# Patient Record
Sex: Female | Born: 1960 | Race: White | Hispanic: No | Marital: Single | Smoking: Current every day smoker
Health system: Southern US, Community
[De-identification: ages and names within clinical notes are randomized; demographics above are authoritative.]

## PROBLEM LIST (undated history)

## (undated) DIAGNOSIS — M1990 Unspecified osteoarthritis, unspecified site: Secondary | ICD-10-CM

## (undated) DIAGNOSIS — F329 Major depressive disorder, single episode, unspecified: Secondary | ICD-10-CM

## (undated) DIAGNOSIS — T4145XA Adverse effect of unspecified anesthetic, initial encounter: Secondary | ICD-10-CM

## (undated) DIAGNOSIS — R635 Abnormal weight gain: Secondary | ICD-10-CM

## (undated) DIAGNOSIS — I10 Essential (primary) hypertension: Secondary | ICD-10-CM

## (undated) DIAGNOSIS — Z780 Asymptomatic menopausal state: Secondary | ICD-10-CM

## (undated) DIAGNOSIS — C801 Malignant (primary) neoplasm, unspecified: Secondary | ICD-10-CM

## (undated) DIAGNOSIS — M545 Low back pain, unspecified: Secondary | ICD-10-CM

## (undated) DIAGNOSIS — F32A Depression, unspecified: Secondary | ICD-10-CM

## (undated) DIAGNOSIS — T7840XA Allergy, unspecified, initial encounter: Secondary | ICD-10-CM

## (undated) DIAGNOSIS — B1920 Unspecified viral hepatitis C without hepatic coma: Secondary | ICD-10-CM

## (undated) DIAGNOSIS — T8859XA Other complications of anesthesia, initial encounter: Secondary | ICD-10-CM

## (undated) DIAGNOSIS — L508 Other urticaria: Secondary | ICD-10-CM

## (undated) DIAGNOSIS — F419 Anxiety disorder, unspecified: Secondary | ICD-10-CM

## (undated) DIAGNOSIS — G709 Myoneural disorder, unspecified: Secondary | ICD-10-CM

## (undated) DIAGNOSIS — M8430XA Stress fracture, unspecified site, initial encounter for fracture: Secondary | ICD-10-CM

## (undated) DIAGNOSIS — J45909 Unspecified asthma, uncomplicated: Secondary | ICD-10-CM

## (undated) DIAGNOSIS — J449 Chronic obstructive pulmonary disease, unspecified: Secondary | ICD-10-CM

## (undated) DIAGNOSIS — F319 Bipolar disorder, unspecified: Secondary | ICD-10-CM

## (undated) DIAGNOSIS — M542 Cervicalgia: Secondary | ICD-10-CM

## (undated) DIAGNOSIS — M810 Age-related osteoporosis without current pathological fracture: Secondary | ICD-10-CM

## (undated) DIAGNOSIS — F39 Unspecified mood [affective] disorder: Secondary | ICD-10-CM

## (undated) HISTORY — DX: Anxiety disorder, unspecified: F41.9

## (undated) HISTORY — DX: Low back pain: M54.5

## (undated) HISTORY — DX: Unspecified osteoarthritis, unspecified site: M19.90

## (undated) HISTORY — PX: WISDOM TOOTH EXTRACTION: SHX21

## (undated) HISTORY — DX: Age-related osteoporosis without current pathological fracture: M81.0

## (undated) HISTORY — DX: Abnormal weight gain: R63.5

## (undated) HISTORY — PX: TUBAL LIGATION: SHX77

## (undated) HISTORY — DX: Asymptomatic menopausal state: Z78.0

## (undated) HISTORY — DX: Allergy, unspecified, initial encounter: T78.40XA

## (undated) HISTORY — DX: Myoneural disorder, unspecified: G70.9

## (undated) HISTORY — DX: Unspecified mood (affective) disorder: F39

## (undated) HISTORY — DX: Depression, unspecified: F32.A

## (undated) HISTORY — DX: Other urticaria: L50.8

## (undated) HISTORY — DX: Major depressive disorder, single episode, unspecified: F32.9

## (undated) HISTORY — DX: Unspecified viral hepatitis C without hepatic coma: B19.20

## (undated) HISTORY — DX: Chronic obstructive pulmonary disease, unspecified: J44.9

## (undated) HISTORY — DX: Low back pain, unspecified: M54.50

## (undated) HISTORY — DX: Essential (primary) hypertension: I10

## (undated) HISTORY — DX: Cervicalgia: M54.2

## (undated) HISTORY — DX: Stress fracture, unspecified site, initial encounter for fracture: M84.30XA

## (undated) HISTORY — DX: Malignant (primary) neoplasm, unspecified: C80.1

## (undated) HISTORY — DX: Unspecified asthma, uncomplicated: J45.909

## (undated) HISTORY — DX: Bipolar disorder, unspecified: F31.9

---

## 2004-05-17 ENCOUNTER — Emergency Department: Payer: Self-pay | Admitting: Unknown Physician Specialty

## 2006-08-09 LAB — HM PAP SMEAR

## 2007-09-21 LAB — HM MAMMOGRAPHY

## 2008-03-29 ENCOUNTER — Emergency Department: Payer: Self-pay | Admitting: Emergency Medicine

## 2008-03-29 ENCOUNTER — Other Ambulatory Visit: Payer: Self-pay

## 2011-06-11 ENCOUNTER — Emergency Department: Payer: Self-pay | Admitting: *Deleted

## 2015-07-07 DIAGNOSIS — R635 Abnormal weight gain: Secondary | ICD-10-CM

## 2015-07-07 DIAGNOSIS — J449 Chronic obstructive pulmonary disease, unspecified: Secondary | ICD-10-CM

## 2015-07-07 DIAGNOSIS — L508 Other urticaria: Secondary | ICD-10-CM

## 2015-07-07 DIAGNOSIS — F32A Depression, unspecified: Secondary | ICD-10-CM | POA: Insufficient documentation

## 2015-07-07 DIAGNOSIS — I10 Essential (primary) hypertension: Secondary | ICD-10-CM

## 2015-07-07 DIAGNOSIS — F39 Unspecified mood [affective] disorder: Secondary | ICD-10-CM

## 2015-07-07 DIAGNOSIS — M545 Low back pain, unspecified: Secondary | ICD-10-CM | POA: Insufficient documentation

## 2015-07-07 DIAGNOSIS — F419 Anxiety disorder, unspecified: Secondary | ICD-10-CM

## 2015-07-07 DIAGNOSIS — J45909 Unspecified asthma, uncomplicated: Secondary | ICD-10-CM | POA: Insufficient documentation

## 2015-07-07 DIAGNOSIS — Z780 Asymptomatic menopausal state: Secondary | ICD-10-CM

## 2015-07-07 DIAGNOSIS — F329 Major depressive disorder, single episode, unspecified: Secondary | ICD-10-CM

## 2015-07-07 DIAGNOSIS — F319 Bipolar disorder, unspecified: Secondary | ICD-10-CM

## 2015-07-07 DIAGNOSIS — M542 Cervicalgia: Secondary | ICD-10-CM

## 2015-07-07 DIAGNOSIS — B1920 Unspecified viral hepatitis C without hepatic coma: Secondary | ICD-10-CM

## 2015-07-08 ENCOUNTER — Encounter: Payer: Self-pay | Admitting: Unknown Physician Specialty

## 2015-07-08 ENCOUNTER — Ambulatory Visit (INDEPENDENT_AMBULATORY_CARE_PROVIDER_SITE_OTHER): Payer: BLUE CROSS/BLUE SHIELD | Admitting: Unknown Physician Specialty

## 2015-07-08 VITALS — BP 146/92 | HR 78 | Temp 98.5°F | Ht 63.1 in | Wt 135.0 lb

## 2015-07-08 DIAGNOSIS — Z0000 Encounter for general adult medical examination without abnormal findings: Secondary | ICD-10-CM | POA: Diagnosis not present

## 2015-07-08 DIAGNOSIS — B1920 Unspecified viral hepatitis C without hepatic coma: Secondary | ICD-10-CM | POA: Diagnosis not present

## 2015-07-08 DIAGNOSIS — Z780 Asymptomatic menopausal state: Secondary | ICD-10-CM

## 2015-07-08 DIAGNOSIS — I10 Essential (primary) hypertension: Secondary | ICD-10-CM | POA: Diagnosis not present

## 2015-07-08 NOTE — Patient Instructions (Signed)
Please do call to schedule your mammogram; the number to schedule one at either [facility] or [facility] is [phone]   

## 2015-07-08 NOTE — Progress Notes (Signed)
BP 146/92 mmHg  Pulse 78  Temp(Src) 98.5 F (36.9 C)  Ht 5' 3.1" (1.603 m)  Wt 135 lb (61.236 kg)  BMI 23.83 kg/m2  SpO2 94%  LMP  (LMP Unknown)   Subjective:    Patient ID: Valerie Goodman, female    DOB: [birth year], 54 y.o.   MRN: [id number]  HPI: Valerie Goodman is a 54 y.o. female  Chief Complaint  Patient presents with  . Annual Exam  . Abdominal Pain    pt states she has hep C and has been having some pain in liver area  . Diarrhea    pt states she has been having trouble with diarrhea, thinks it may be anxiety related   Hypertension Using medications without difficulty Average home BPs   No problems or lightheadedness No chest pain with exertion or shortness of breath No Edema   Relevant past medical, surgical, family and social history reviewed and updated as indicated. Interim medical history since our last visit reviewed. Allergies and medications reviewed and updated.  Review of Systems  Constitutional: Negative.   HENT: Negative.   Eyes: Negative.   Respiratory: Negative.   Cardiovascular: Negative.   Gastrointestinal: Positive for abdominal pain and diarrhea.       She had a biopsy done for her hep C and "turned me loose."  Admits to drinking "al little bit" of wine.  Takes Milk Thistle.  This was about 25 years ago.  Refusing colonoscopy  Endocrine: Negative.   Genitourinary: Negative.   Musculoskeletal: Negative.   Skin: Negative.   Allergic/Immunologic: Negative.   Neurological: Negative.   Psychiatric/Behavioral: Negative.     Per HPI unless specifically indicated above     Objective:    BP 146/92 mmHg  Pulse 78  Temp(Src) 98.5 F (36.9 C)  Ht 5' 3.1" (1.603 m)  Wt 135 lb (61.236 kg)  BMI 23.83 kg/m2  SpO2 94%  LMP  (LMP Unknown)  Wt Readings from Last 3 Encounters:  07/08/15 135 lb (61.236 kg)  07/05/14 115 lb (52.164 kg)    Physical Exam  Constitutional: She is oriented to person, place, and time. She appears well-developed and  well-nourished.  HENT:  Head: Normocephalic and atraumatic.  Eyes: Pupils are equal, round, and reactive to light. Right eye exhibits no discharge. Left eye exhibits no discharge. No scleral icterus.  Neck: Normal range of motion. Neck supple. Carotid bruit is not present. No thyromegaly present.  Cardiovascular: Normal rate, regular rhythm and normal heart sounds.  Exam reveals no gallop and no friction rub.   No murmur heard. Pulmonary/Chest: Effort normal and breath sounds normal. No respiratory distress. She has no wheezes. She has no rales.  Abdominal: Soft. Bowel sounds are normal. There is no tenderness. There is no rebound.  Genitourinary: Rectum normal and vagina normal. No breast swelling, tenderness or discharge. Pelvic exam was performed with patient prone. There is no rash, tenderness or lesion on the right labia. There is no rash, tenderness or lesion on the left labia. Cervix exhibits no motion tenderness, no discharge and no friability.  Musculoskeletal: Normal range of motion.  Lymphadenopathy:    She has no cervical adenopathy.  Neurological: She is alert and oriented to person, place, and time.  Skin: Skin is warm, dry and intact. No rash noted.  Psychiatric: She has a normal mood and affect. Her speech is normal and behavior is normal. Judgment and thought content normal. Cognition and memory are normal.  Vitals reviewed.  Results for orders placed or performed in visit on 07/07/15  HM MAMMOGRAPHY  Result Value Ref Range   HM Mammogram from PP   HM PAP SMEAR  Result Value Ref Range   HM Pap smear from PP       Assessment & Plan:   Problem List Items Addressed This Visit      Unprioritized   HCV (hepatitis C virus) - Primary   Relevant Orders   Comprehensive metabolic panel   Menopause   Relevant Orders   DG Bone Density   Hypertension    Other Visit Diagnoses    Annual physical exam        Relevant Orders    CBC with Differential/Platelet    HIV  antibody    Lipid Panel w/o Chol/HDL Ratio    TSH    VITAMIN D 25 Hydroxy (Vit-D Deficiency, Fractures)    MM DIGITAL SCREENING BILATERAL    DG Bone Density    IGP, Aptima HPV, rfx 16/18,45        Follow up plan: Return in about 6 months (around 01/06/2016).

## 2015-07-09 ENCOUNTER — Other Ambulatory Visit: Payer: Self-pay | Admitting: Unknown Physician Specialty

## 2015-07-09 DIAGNOSIS — B182 Chronic viral hepatitis C: Secondary | ICD-10-CM

## 2015-07-09 LAB — COMPREHENSIVE METABOLIC PANEL
ALBUMIN: 4.4 g/dL (ref 3.5–5.5)
ALT: 68 IU/L — ABNORMAL HIGH (ref 0–32)
AST: 59 IU/L — AB (ref 0–40)
Albumin/Globulin Ratio: 1.6 (ref 1.1–2.5)
Alkaline Phosphatase: 126 IU/L — ABNORMAL HIGH (ref 39–117)
BUN/Creatinine Ratio: 19 (ref 9–23)
BUN: 12 mg/dL (ref 6–24)
Bilirubin Total: 0.3 mg/dL (ref 0.0–1.2)
CO2: 25 mmol/L (ref 18–29)
CREATININE: 0.62 mg/dL (ref 0.57–1.00)
Calcium: 10 mg/dL (ref 8.7–10.2)
Chloride: 101 mmol/L (ref 96–106)
GFR calc Af Amer: 117 mL/min/{1.73_m2} (ref >59)
GFR calc non Af Amer: 102 mL/min/{1.73_m2} (ref >59)
GLUCOSE: 91 mg/dL (ref 65–99)
Globulin, Total: 2.7 g/dL (ref 1.5–4.5)
Potassium: 4.6 mmol/L (ref 3.5–5.2)
Sodium: 140 mmol/L (ref 134–144)
Total Protein: 7.1 g/dL (ref 6.0–8.5)

## 2015-07-09 LAB — CBC WITH DIFFERENTIAL/PLATELET
Basophils Absolute: 0.1 10*3/uL (ref 0.0–0.2)
Basos: 1 %
EOS (ABSOLUTE): 0.3 10*3/uL (ref 0.0–0.4)
Eos: 2 %
HEMOGLOBIN: 15.4 g/dL (ref 11.1–15.9)
Hematocrit: 44.6 % (ref 34.0–46.6)
Immature Grans (Abs): 0.1 10*3/uL (ref 0.0–0.1)
Immature Granulocytes: 1 %
LYMPHS ABS: 3.1 10*3/uL (ref 0.7–3.1)
LYMPHS: 29 %
MCH: 30.9 pg (ref 26.6–33.0)
MCHC: 34.5 g/dL (ref 31.5–35.7)
MCV: 90 fL (ref 79–97)
Monocytes Absolute: 0.9 10*3/uL (ref 0.1–0.9)
Monocytes: 8 %
Neutrophils Absolute: 6.5 10*3/uL (ref 1.4–7.0)
Neutrophils: 59 %
Platelets: 260 10*3/uL (ref 150–379)
RBC: 4.98 x10E6/uL (ref 3.77–5.28)
RDW: 14.7 % (ref 12.3–15.4)
WBC: 10.9 10*3/uL — ABNORMAL HIGH (ref 3.4–10.8)

## 2015-07-09 LAB — HIV ANTIBODY (ROUTINE TESTING W REFLEX): HIV Screen 4th Generation wRfx: NONREACTIVE

## 2015-07-09 LAB — TSH: TSH: 1.8 u[IU]/mL (ref 0.450–4.500)

## 2015-07-09 LAB — LIPID PANEL W/O CHOL/HDL RATIO
Cholesterol, Total: 209 mg/dL — ABNORMAL HIGH (ref 100–199)
HDL: 98 mg/dL (ref >39)
LDL CALC: 93 mg/dL (ref 0–99)
Triglycerides: 91 mg/dL (ref 0–149)
VLDL Cholesterol Cal: 18 mg/dL (ref 5–40)

## 2015-07-09 LAB — VITAMIN D 25 HYDROXY (VIT D DEFICIENCY, FRACTURES): VIT D 25 HYDROXY: 44.2 ng/mL (ref 30.0–100.0)

## 2015-07-15 LAB — IGP, APTIMA HPV, RFX 16/18,45
HPV Aptima: POSITIVE — AB
HPV Genotype 16: NEGATIVE
HPV Genotype 18,45: NEGATIVE
PAP Smear Comment: 0

## 2015-07-24 ENCOUNTER — Encounter: Payer: Self-pay | Admitting: Gastroenterology

## 2015-07-24 ENCOUNTER — Encounter: Payer: Self-pay | Admitting: Unknown Physician Specialty

## 2015-07-24 ENCOUNTER — Telehealth: Payer: Self-pay

## 2015-07-24 DIAGNOSIS — IMO0002 Reserved for concepts with insufficient information to code with codable children: Secondary | ICD-10-CM

## 2015-07-24 NOTE — Telephone Encounter (Signed)
When I called patient about other results the other day, she asked about her pap smear results.

## 2015-07-24 NOTE — Telephone Encounter (Signed)
Pt with normal pap smear and normal HPV.  Will need to recheck in 1 year.

## 2015-07-25 NOTE — Telephone Encounter (Signed)
Left message with pt to do co testing in 1 year.

## 2015-07-25 NOTE — Telephone Encounter (Signed)
Elnita MaxwellCheryl, please document the phone call to the patient so we can close this encounter.

## 2015-08-25 ENCOUNTER — Ambulatory Visit (INDEPENDENT_AMBULATORY_CARE_PROVIDER_SITE_OTHER): Payer: BLUE CROSS/BLUE SHIELD | Admitting: Gastroenterology

## 2015-08-25 ENCOUNTER — Encounter: Payer: Self-pay | Admitting: Gastroenterology

## 2015-08-25 ENCOUNTER — Encounter (INDEPENDENT_AMBULATORY_CARE_PROVIDER_SITE_OTHER): Payer: Self-pay

## 2015-08-25 VITALS — BP 152/96 | HR 76 | Temp 98.2°F | Ht 63.0 in | Wt 134.5 lb

## 2015-08-25 DIAGNOSIS — B182 Chronic viral hepatitis C: Secondary | ICD-10-CM

## 2015-08-25 NOTE — Progress Notes (Signed)
Gastroenterology Consultation  Referring Provider:     No ref. provider found Primary Care Physician:  Vonita Moss, MD Primary Gastroenterologist:  Dr. Servando Snare     Reason for Consultation:     Hepatitis C        HPI:   Valerie Goodman is a 55 y.o. y/o female referred for consultation & management of hepatitis C by Dr. Vonita Moss, MD.  This patient comes in today with a history of hepatitis C. The patient was evaluated many years ago at [facility] by Dr. Foy Guadalajara and had a liver biopsy at that time. The patient reports that she drinks approximately a half liter of wine every night. The patient states that she believes she was infected with hepatitis C and using IV drugs back in the early 90s. The patient denies any further use of IV drugs. There is no report of any unexplained weight loss black stools or bloody stools. The patient also denies ever having a screening colonoscopy in the past. There is no report of any abdominal pain, jaundice, fevers or chills. She does not know the genotype of the hepatitis C she has noted she know the results of the liver biopsy done back when she was evaluated in the past.  Past Medical History  Diagnosis Date  . HCV (hepatitis C virus)   . Low back pain   . Cervical pain   . Menopause   . Depression   . Anxiety   . Asthma   . Abnormal weight gain   . Bipolar 1 disorder (HCC)   . Chronic urticaria   . COPD (chronic obstructive pulmonary disease) (HCC)   . Mood disorder (HCC)   . Hypertension   . Stress fracture     Past Surgical History  Procedure Laterality Date  . Tubal ligation      Prior to Admission medications   Medication Sig Start Date End Date Taking? Authorizing Provider  albuterol (PROVENTIL HFA;VENTOLIN HFA) 108 (90 BASE) MCG/ACT inhaler Inhale 2 puffs into the lungs every 6 (six) hours as needed for wheezing or shortness of breath.   Yes Historical Provider, MD  budesonide-formoterol (SYMBICORT) 160-4.5 MCG/ACT inhaler Inhale 2 puffs into  the lungs 2 (two) times daily.   Yes Historical Provider, MD  busPIRone (BUSPAR) 15 MG tablet Take 15 mg by mouth.   Yes Historical Provider, MD  gabapentin (NEURONTIN) 300 MG capsule Take 300 mg by mouth 3 (three) times daily.   Yes Historical Provider, MD  lisinopril (PRINIVIL,ZESTRIL) 10 MG tablet Take 10 mg by mouth daily.   Yes Historical Provider, MD  sertraline (ZOLOFT) 100 MG tablet Take 100 mg by mouth daily.   Yes Historical Provider, MD    Family History  Problem Relation Age of Onset  . Cancer Mother     uterine  . Arthritis Mother   . Cancer Father   . Mental illness Father     bipolar  . Hypertension Son   . Asthma Son   . Polymyositis Maternal Grandmother   . Cancer Maternal Grandfather   . Heart disease Paternal Grandmother     MI  . Cancer Paternal Grandfather   . Cancer Son     melanoma     Social History  Substance Use Topics  . Smoking status: Current Every Day Smoker -- 1.00 packs/day    Types: Cigarettes  . Smokeless tobacco: Never Used  . Alcohol Use: 0.0 oz/week    0 Standard drinks or equivalent per week  Comment: on occasion    Allergies as of 08/25/2015 - Review Complete 08/25/2015  Allergen Reaction Noted  . Mobic [meloxicam] Swelling 07/07/2015    Review of Systems:    All systems reviewed and negative except where noted in HPI.   Physical Exam:  BP 152/96 mmHg  Pulse 76  Temp(Src) 98.2 F (36.8 C) (Oral)  Ht  (1.6 m)  Wt 134 lb 8 oz (61.009 kg)  BMI 23.83 kg/m2  LMP  (LMP Unknown) No LMP recorded (lmp unknown). Patient is postmenopausal. Psych:  Alert and cooperative. Normal mood and affect. General:   Alert,  Well-developed, well-nourished, pleasant and cooperative in NAD Head:  Normocephalic and atraumatic. Eyes:  Sclera clear, no icterus.   Conjunctiva pink. Ears:  Normal auditory acuity. Nose:  No deformity, discharge, or lesions. Mouth:  No deformity or lesions,oropharynx pink & moist. Neck:  Supple; no masses or  thyromegaly. Lungs:  Respirations even and unlabored.  Clear throughout to auscultation.   No wheezes, crackles, or rhonchi. No acute distress. Heart:  Regular rate and rhythm; no murmurs, clicks, rubs, or gallops. Abdomen:  Normal bowel sounds.  No bruits.  Soft, non-tender and non-distended without masses, hepatosplenomegaly or hernias noted.  No guarding or rebound tenderness.  Negative Carnett sign.   Rectal:  Deferred.  Msk:  Symmetrical without gross deformities.  Good, equal movement & strength bilaterally. Pulses:  Normal pulses noted. Extremities:  No clubbing or edema.  No cyanosis. Neurologic:  Alert and oriented x3;  grossly normal neurologically. Skin:  Intact without significant lesions or rashes.  No jaundice. Lymph Nodes:  No significant cervical adenopathy. Psych:  Alert and cooperative. Normal mood and affect.  Imaging Studies: No results found.  Assessment and Plan:   Valerie Goodman is a 55 y.o. y/o female who has a history of hepatitis C but has never been treated. The patient has been told to stop drinking her half liter of wine per day that she has been doing. The patient will be sent off for more current labs and for genotype. She will also have a fibrosis scan prior to starting treatment. The patient has been told that she needs to avoid drinking during treatment. She will also consider having a screening colonoscopy. The patient was told the risks of not having a colonoscopy but the patient states that she will think about it because she is changing positions at work and she may not be able to take off the time to have a colonoscopy. She reports that she will call us back if she decides to have the colonoscopy. Otherwise the patient will be evaluated for treatment for hepatitis C and will be contacted with the results of her labs.   Note: This dictation was prepared with Dragon dictation along with smaller phrase technology. Any transcriptional errors that result from  this process are unintentional.

## 2015-08-26 LAB — HEPATITIS B SURFACE ANTIBODY,QUALITATIVE: Hep B Surface Ab, Qual: NONREACTIVE

## 2015-08-26 LAB — HEPATITIS B SURFACE ANTIGEN: Hepatitis B Surface Ag: NEGATIVE

## 2015-08-27 ENCOUNTER — Telehealth: Payer: Self-pay | Admitting: Family Medicine

## 2015-08-27 NOTE — Telephone Encounter (Signed)
Routing to provider  

## 2015-08-27 NOTE — Telephone Encounter (Signed)
The orders are in the chart.

## 2015-08-27 NOTE — Telephone Encounter (Signed)
Patient called needing a referral to have her mammogram and bone density. She wants to know when and where she would go to get this done. Please call patient to the attached phone number, thanks.

## 2015-08-27 NOTE — Telephone Encounter (Signed)
Called and let patient know orders were already placed and gave patient the number for [facility] to call and schedule her appointments.

## 2015-08-28 ENCOUNTER — Telehealth: Payer: Self-pay | Admitting: Gastroenterology

## 2015-08-28 NOTE — Telephone Encounter (Signed)
colonoscopy

## 2015-08-29 LAB — IGG, IGA, IGM

## 2015-08-29 LAB — CERULOPLASMIN

## 2015-08-29 LAB — HEPATITIS C GENOTYPE

## 2015-08-29 LAB — HEPATITIS A ANTIBODY, TOTAL

## 2015-08-29 LAB — ALPHA-1-ANTITRYPSIN

## 2015-08-29 LAB — HEPATITIS C ANTIBODY

## 2015-08-29 LAB — ANTI-SMOOTH MUSCLE ANTIBODY, IGG

## 2015-08-31 LAB — IGG, IGA, IGM
IGA/IMMUNOGLOBULIN A, SERUM: 232 mg/dL (ref 87–352)
IGM (IMMUNOGLOBULIN M), SRM: 168 mg/dL (ref 26–217)
IgG (Immunoglobin G), Serum: 1150 mg/dL (ref 700–1600)

## 2015-08-31 LAB — HEPATITIS C ANTIBODY

## 2015-08-31 LAB — SPECIMEN STATUS REPORT

## 2015-08-31 LAB — ANTI-SMOOTH MUSCLE ANTIBODY, IGG: SMOOTH MUSCLE AB: 10 U (ref 0–19)

## 2015-08-31 LAB — ALPHA-1-ANTITRYPSIN: A-1 Antitrypsin: 160 mg/dL (ref 90–200)

## 2015-08-31 LAB — HEPATITIS C GENOTYPE

## 2015-08-31 LAB — CERULOPLASMIN: Ceruloplasmin: 37.8 mg/dL (ref 19.0–39.0)

## 2015-08-31 LAB — HEPATITIS A ANTIBODY, TOTAL: HEP A TOTAL AB: NEGATIVE

## 2015-09-01 LAB — HCV RT-PCR, QUANT (NON-GRAPH)

## 2015-09-01 LAB — CBC WITH DIFFERENTIAL/PLATELET
BASOS ABS: 0.1 10*3/uL (ref 0.0–0.2)
Basos: 0 %
EOS (ABSOLUTE): 0.3 10*3/uL (ref 0.0–0.4)
EOS: 3 %
HEMATOCRIT: 47.2 % — AB (ref 34.0–46.6)
HEMOGLOBIN: 16.2 g/dL — AB (ref 11.1–15.9)
IMMATURE GRANS (ABS): 0.1 10*3/uL (ref 0.0–0.1)
IMMATURE GRANULOCYTES: 1 %
LYMPHS: 32 %
Lymphocytes Absolute: 3.7 10*3/uL — ABNORMAL HIGH (ref 0.7–3.1)
MCH: 29.8 pg (ref 26.6–33.0)
MCHC: 34.3 g/dL (ref 31.5–35.7)
MCV: 87 fL (ref 79–97)
MONOCYTES: 7 %
Monocytes Absolute: 0.9 10*3/uL (ref 0.1–0.9)
NEUTROS PCT: 57 %
Neutrophils Absolute: 6.7 10*3/uL (ref 1.4–7.0)
Platelets: 273 10*3/uL (ref 150–379)
RBC: 5.43 x10E6/uL — AB (ref 3.77–5.28)
RDW: 14.3 % (ref 12.3–15.4)
WBC: 11.6 10*3/uL — AB (ref 3.4–10.8)

## 2015-09-01 LAB — HEPATIC FUNCTION PANEL
ALBUMIN: 4.8 g/dL (ref 3.5–5.5)
ALK PHOS: 125 IU/L — AB (ref 39–117)
ALT: 61 IU/L — ABNORMAL HIGH (ref 0–32)
AST: 63 IU/L — AB (ref 0–40)
BILIRUBIN TOTAL: 0.2 mg/dL (ref 0.0–1.2)
Bilirubin, Direct: 0.11 mg/dL (ref 0.00–0.40)
TOTAL PROTEIN: 8 g/dL (ref 6.0–8.5)

## 2015-09-01 LAB — PROTIME-INR
INR: 1 (ref 0.8–1.2)
Prothrombin Time: 10.1 s (ref 9.1–12.0)

## 2015-09-01 LAB — MITOCHONDRIAL ANTIBODIES: MITOCHONDRIAL AB: 3.3 U (ref 0.0–20.0)

## 2015-09-01 NOTE — Telephone Encounter (Signed)
Pt was seen in office with Dr. Servando Snare  On 07/3015 and informed him if she decided to have a colonoscopy she would call back and scheduled. See office note.

## 2015-09-02 ENCOUNTER — Other Ambulatory Visit: Payer: Self-pay

## 2015-09-02 ENCOUNTER — Telehealth: Payer: Self-pay

## 2015-09-02 NOTE — Telephone Encounter (Signed)
-----   Message from Darren Wohl, MD sent at 09/01/2015  6:40 PM EST ----- The patient had hepatitis C genotype one and needs to be set up for Treatment. 

## 2015-09-02 NOTE — Telephone Encounter (Signed)
-----   Message from Midge Minium, MD sent at 09/01/2015  6:40 PM EST ----- The patient had hepatitis C genotype one and needs to be set up for Treatment.

## 2015-09-02 NOTE — Telephone Encounter (Signed)
Pt notified of lab results. Pt has decided she wanted a screening colonoscopy

## 2015-09-02 NOTE — Telephone Encounter (Signed)
Pt scheduled for her screening colonoscopy on Friday, Feb 17th. Instructs/rx mailed.

## 2015-09-02 NOTE — Telephone Encounter (Signed)
Pt has been notified of lab results. Pt has been scheduled for US/tissue elastography at [facility] on2/8/17. Pt is aware we are waiting on [place] prior to starting treatment.

## 2015-09-03 ENCOUNTER — Ambulatory Visit
Admission: RE | Admit: 2015-09-03 | Discharge: 2015-09-03 | Disposition: A | Discharge status: Home / Self Care | Payer: BLUE CROSS/BLUE SHIELD | Source: Ambulatory Visit | Attending: Gastroenterology | Admitting: Gastroenterology

## 2015-09-03 ENCOUNTER — Inpatient Hospital Stay: Admission: RE | Admit: 2015-09-03 | Payer: Self-pay | Source: Ambulatory Visit

## 2015-09-03 DIAGNOSIS — B182 Chronic viral hepatitis C: Secondary | ICD-10-CM | POA: Insufficient documentation

## 2015-09-03 DIAGNOSIS — R160 Hepatomegaly, not elsewhere classified: Secondary | ICD-10-CM | POA: Insufficient documentation

## 2015-09-05 ENCOUNTER — Ambulatory Visit
Admission: RE | Admit: 2015-09-05 | Discharge: 2015-09-05 | Disposition: A | Discharge status: Home / Self Care | Payer: BLUE CROSS/BLUE SHIELD | Source: Ambulatory Visit | Attending: Gastroenterology | Admitting: Gastroenterology

## 2015-09-05 DIAGNOSIS — B182 Chronic viral hepatitis C: Secondary | ICD-10-CM | POA: Insufficient documentation

## 2015-09-08 ENCOUNTER — Telehealth: Payer: Self-pay

## 2015-09-08 NOTE — Telephone Encounter (Signed)
LVM for pt to return my call.

## 2015-09-08 NOTE — Telephone Encounter (Signed)
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

## 2015-09-09 ENCOUNTER — Ambulatory Visit
Admission: RE | Admit: 2015-09-09 | Discharge: 2015-09-09 | Disposition: A | Discharge status: Home / Self Care | Payer: BLUE CROSS/BLUE SHIELD | Source: Ambulatory Visit | Attending: Unknown Physician Specialty | Admitting: Unknown Physician Specialty

## 2015-09-09 ENCOUNTER — Telehealth: Payer: Self-pay

## 2015-09-09 DIAGNOSIS — Z0000 Encounter for general adult medical examination without abnormal findings: Secondary | ICD-10-CM

## 2015-09-09 DIAGNOSIS — M810 Age-related osteoporosis without current pathological fracture: Secondary | ICD-10-CM | POA: Insufficient documentation

## 2015-09-09 DIAGNOSIS — Z780 Asymptomatic menopausal state: Secondary | ICD-10-CM | POA: Insufficient documentation

## 2015-09-09 DIAGNOSIS — Z1231 Encounter for screening mammogram for malignant neoplasm of breast: Secondary | ICD-10-CM | POA: Diagnosis present

## 2015-09-09 DIAGNOSIS — Z13820 Encounter for screening for osteoporosis: Secondary | ICD-10-CM | POA: Insufficient documentation

## 2015-09-09 NOTE — Telephone Encounter (Signed)
Pt notified of results

## 2015-09-09 NOTE — Telephone Encounter (Signed)
Pt notified of [place] results. Advised pt I will be submitting her information to get approval to start treatment.

## 2015-09-09 NOTE — Telephone Encounter (Signed)
Patient returned your phone call regarding results °

## 2015-09-09 NOTE — Telephone Encounter (Signed)
-----   Message from Midge Minium, MD sent at 09/03/2015  9:17 AM EST ----- Let the patient know that her liver was enlarged but no masses or cancers were seen

## 2015-09-10 ENCOUNTER — Telehealth: Payer: Self-pay | Admitting: Unknown Physician Specialty

## 2015-09-10 MED ORDER — ALENDRONATE SODIUM 70 MG PO TABS: 70.0000 mg | ORAL_TABLET | ORAL | Status: DC

## 2015-09-10 NOTE — Telephone Encounter (Signed)
I called back but no answer.  Please let her know that she has osteoporosis on her bone density test.  I would like to start her on a monthly medication for it.    Thanks

## 2015-09-10 NOTE — Discharge Instructions (Signed)

## 2015-09-10 NOTE — Telephone Encounter (Signed)
Patient returned call and was notified of results. Patient is okay with starting a medication but she wants to know what the medication is. She says that when we call back, we can leave a voicemail on her phone.

## 2015-09-10 NOTE — Telephone Encounter (Signed)
Pt called stated she is returning a call regarding lab results. Thanks.

## 2015-09-10 NOTE — Telephone Encounter (Signed)
I did not call this patient. Cheryl, did you? 

## 2015-09-10 NOTE — Telephone Encounter (Signed)
It's called Adrenolate and it is a weekly medication.  I sent it to the pharmacy

## 2015-09-10 NOTE — Telephone Encounter (Signed)
Called and left patient a voicemail letting her know the name of the medication and that it has been sent to her pharmacy for her.

## 2015-09-12 ENCOUNTER — Ambulatory Visit
Admission: RE | Admit: 2015-09-12 | Discharge: 2015-09-12 | Disposition: A | Discharge status: Home / Self Care | Payer: BLUE CROSS/BLUE SHIELD | Source: Ambulatory Visit | Attending: Gastroenterology | Admitting: Gastroenterology

## 2015-09-12 ENCOUNTER — Ambulatory Visit: Payer: BLUE CROSS/BLUE SHIELD | Admitting: Anesthesiology

## 2015-09-12 ENCOUNTER — Encounter
Admission: RE | Disposition: A | Discharge status: Home / Self Care | Payer: Self-pay | Source: Ambulatory Visit | Attending: Gastroenterology

## 2015-09-12 DIAGNOSIS — Z7951 Long term (current) use of inhaled steroids: Secondary | ICD-10-CM | POA: Diagnosis not present

## 2015-09-12 DIAGNOSIS — Z8049 Family history of malignant neoplasm of other genital organs: Secondary | ICD-10-CM | POA: Diagnosis not present

## 2015-09-12 DIAGNOSIS — F39 Unspecified mood [affective] disorder: Secondary | ICD-10-CM | POA: Insufficient documentation

## 2015-09-12 DIAGNOSIS — F17210 Nicotine dependence, cigarettes, uncomplicated: Secondary | ICD-10-CM | POA: Diagnosis not present

## 2015-09-12 DIAGNOSIS — Z1211 Encounter for screening for malignant neoplasm of colon: Secondary | ICD-10-CM | POA: Insufficient documentation

## 2015-09-12 DIAGNOSIS — K5730 Diverticulosis of large intestine without perforation or abscess without bleeding: Secondary | ICD-10-CM | POA: Insufficient documentation

## 2015-09-12 DIAGNOSIS — J449 Chronic obstructive pulmonary disease, unspecified: Secondary | ICD-10-CM | POA: Diagnosis not present

## 2015-09-12 DIAGNOSIS — Z808 Family history of malignant neoplasm of other organs or systems: Secondary | ICD-10-CM | POA: Diagnosis not present

## 2015-09-12 DIAGNOSIS — Z825 Family history of asthma and other chronic lower respiratory diseases: Secondary | ICD-10-CM | POA: Insufficient documentation

## 2015-09-12 DIAGNOSIS — M545 Low back pain: Secondary | ICD-10-CM | POA: Insufficient documentation

## 2015-09-12 DIAGNOSIS — L508 Other urticaria: Secondary | ICD-10-CM | POA: Diagnosis not present

## 2015-09-12 DIAGNOSIS — K641 Second degree hemorrhoids: Secondary | ICD-10-CM | POA: Diagnosis not present

## 2015-09-12 DIAGNOSIS — Z8249 Family history of ischemic heart disease and other diseases of the circulatory system: Secondary | ICD-10-CM | POA: Insufficient documentation

## 2015-09-12 DIAGNOSIS — Z8261 Family history of arthritis: Secondary | ICD-10-CM | POA: Diagnosis not present

## 2015-09-12 DIAGNOSIS — Z818 Family history of other mental and behavioral disorders: Secondary | ICD-10-CM | POA: Insufficient documentation

## 2015-09-12 DIAGNOSIS — Z803 Family history of malignant neoplasm of breast: Secondary | ICD-10-CM | POA: Diagnosis not present

## 2015-09-12 DIAGNOSIS — J45909 Unspecified asthma, uncomplicated: Secondary | ICD-10-CM | POA: Insufficient documentation

## 2015-09-12 DIAGNOSIS — Z888 Allergy status to other drugs, medicaments and biological substances status: Secondary | ICD-10-CM | POA: Diagnosis not present

## 2015-09-12 DIAGNOSIS — Z8619 Personal history of other infectious and parasitic diseases: Secondary | ICD-10-CM | POA: Insufficient documentation

## 2015-09-12 DIAGNOSIS — F319 Bipolar disorder, unspecified: Secondary | ICD-10-CM | POA: Insufficient documentation

## 2015-09-12 DIAGNOSIS — Z79899 Other long term (current) drug therapy: Secondary | ICD-10-CM | POA: Diagnosis not present

## 2015-09-12 DIAGNOSIS — I10 Essential (primary) hypertension: Secondary | ICD-10-CM | POA: Insufficient documentation

## 2015-09-12 DIAGNOSIS — F419 Anxiety disorder, unspecified: Secondary | ICD-10-CM | POA: Insufficient documentation

## 2015-09-12 HISTORY — PX: COLONOSCOPY WITH PROPOFOL: SHX5780

## 2015-09-12 HISTORY — DX: Adverse effect of unspecified anesthetic, initial encounter: T41.45XA

## 2015-09-12 HISTORY — DX: Other complications of anesthesia, initial encounter: T88.59XA

## 2015-09-12 SURGERY — COLONOSCOPY WITH PROPOFOL
Anesthesia: Monitor Anesthesia Care

## 2015-09-12 MED ORDER — LIDOCAINE HCL (CARDIAC) 20 MG/ML IV SOLN
INTRAVENOUS | Status: DC | PRN
  Administered 2015-09-12: 30 mg via INTRAVENOUS

## 2015-09-12 MED ORDER — SIMETHICONE 40 MG/0.6ML PO SUSP
ORAL | Status: DC | PRN
  Administered 2015-09-12: 11:00:00

## 2015-09-12 MED ORDER — PROPOFOL 10 MG/ML IV BOLUS
INTRAVENOUS | Status: DC | PRN
  Administered 2015-09-12 (×5): 50 mg via INTRAVENOUS
  Administered 2015-09-12: 100 mg via INTRAVENOUS
  Administered 2015-09-12: 50 mg via INTRAVENOUS

## 2015-09-12 MED ORDER — LACTATED RINGERS IV SOLN
INTRAVENOUS | Status: DC
  Administered 2015-09-12 (×2): via INTRAVENOUS

## 2015-09-12 SURGICAL SUPPLY — 28 items

## 2015-09-12 NOTE — Anesthesia Preprocedure Evaluation (Signed)
Anesthesia Evaluation  Patient identified by MRN, date of birth, ID band  Reviewed: NPO status   History of Anesthesia Complications (+) history of anesthetic complications (wakes up during procedures, hard to sedate)  Airway Mallampati: II  TM Distance: >3 FB Neck ROM: full    Dental  (+)    Pulmonary asthma , COPD (mild), Current Smoker,    Pulmonary exam normal        Cardiovascular Exercise Tolerance: Good hypertension, Normal cardiovascular exam     Neuro/Psych PSYCHIATRIC DISORDERS Anxiety Depression Bipolar Disorder negative neurological ROS     GI/Hepatic negative GI ROS, (+) Hepatitis -, C  Endo/Other  negative endocrine ROS  Renal/GU negative Renal ROS  negative genitourinary   Musculoskeletal   Abdominal   Peds  Hematology negative hematology ROS (+)   Anesthesia Other Findings   Reproductive/Obstetrics                             Anesthesia Physical Anesthesia Plan  ASA: II  Anesthesia Plan: MAC   Post-op Pain Management:    Induction:   Airway Management Planned:   Additional Equipment:   Intra-op Plan:   Post-operative Plan:   Informed Consent: I have reviewed the patients History and Physical, chart, labs and discussed the procedure including the risks, benefits and alternatives for the proposed anesthesia with the patient or authorized representative who has indicated his/her understanding and acceptance.     Plan Discussed with: CRNA  Anesthesia Plan Comments:         Anesthesia Quick Evaluation

## 2015-09-12 NOTE — Transfer of Care (Signed)
Immediate Anesthesia Transfer of Care Note  Patient: Valerie Goodman  Procedure(s) Performed: Procedure(s): COLONOSCOPY WITH PROPOFOL (N/A)  Patient Location: PACU  Anesthesia Type: MAC  Level of Consciousness: awake, alert  and patient cooperative  Airway and Oxygen Therapy: Patient Spontanous Breathing and Patient connected to supplemental oxygen  Post-op Assessment: Post-op Vital signs reviewed, Patient's Cardiovascular Status Stable, Respiratory Function Stable, Patent Airway and No signs of Nausea or vomiting  Post-op Vital Signs: Reviewed and stable  Complications: No apparent anesthesia complications

## 2015-09-12 NOTE — Anesthesia Procedure Notes (Signed)
Procedure Name: MAC Performed by: Seung Nidiffer Pre-anesthesia Checklist: Patient identified, Emergency Drugs available, Suction available, Patient being monitored and Timeout performed Patient Re-evaluated:Patient Re-evaluated prior to inductionOxygen Delivery Method: Nasal cannula       

## 2015-09-12 NOTE — Op Note (Signed)
[facility] Patient Name: Valerie Goodman Procedure Date: 09/12/2015 10:47 AM MRN: [id number] Account #: [id number] Date of Birth: [birth year] Admit Type: Outpatient Age: 55 Room: [facility] OR ROOM 01 Gender: Female Note Status: Finalized Procedure:            Colonoscopy Indications:          Screening for colorectal malignant neoplasm Providers:            Midge Minium, MD Referring MD:         Steele Sizer, MD (Referring MD) Medicines:            Propofol per Anesthesia Complications:        No immediate complications. Procedure:            Pre-Anesthesia Assessment:                       - Prior to the procedure, a History and Physical was                        performed, and patient medications and allergies were                        reviewed. The patient's tolerance of previous                        anesthesia was also reviewed. The risks and benefits of                        the procedure and the sedation options and risks were                        discussed with the patient. All questions were                        answered, and informed consent was obtained. Prior                        Anticoagulants: The patient has taken no previous                        anticoagulant or antiplatelet agents. ASA Grade                        Assessment: II - A patient with mild systemic disease.                        After reviewing the risks and benefits, the patient was                        deemed in satisfactory condition to undergo the                        procedure.                       After obtaining informed consent, the colonoscope was                        passed under direct vision. Throughout the procedure,  the patient's blood pressure, pulse, and oxygen                        saturations were monitored continuously. The Olympus CF                        H180AL colonoscope (S#: [id number]) was introduced through                         the anus and advanced to the the cecum, identified by                        appendiceal orifice and ileocecal valve. The                        colonoscopy was performed without difficulty. The                        patient tolerated the procedure well. The quality of                        the bowel preparation was excellent. Findings:      The perianal and digital rectal examinations were normal.      A few small-mouthed diverticula were found in the entire colon.      Internal hemorrhoids were found during retroflexion. The hemorrhoids       were Grade II (internal hemorrhoids that prolapse but reduce       spontaneously). Impression:           - Diverticulosis in the entire examined colon.                       - Internal hemorrhoids.                       - No specimens collected. Recommendation:       - Repeat colonoscopy in 10 years for screening unless                        any change in family history or lower GI problems. Procedure Code(s):    --- Professional ---                       [phone], Colonoscopy, flexible; diagnostic, including                        collection of specimen(s) by brushing or washing, when                        performed (separate procedure) Diagnosis Code(s):    --- Professional ---                       Z12.11, Encounter for screening for malignant neoplasm                        of colon CPT copyright 2016 [employer]. All rights reserved. The codes documented in this report are preliminary and upon coder review may  be revised to meet current compliance requirements. Midge Minium, MD 09/12/2015 11:12:10 AM This report has been signed electronically. Number of Addenda: 0 Note  Initiated On: 09/12/2015 10:47 AM Scope Withdrawal Time: 0 hours 8 minutes 8 seconds  Total Procedure Duration: 0 hours 14 minutes 27 seconds       [facility]

## 2015-09-12 NOTE — H&P (Signed)
[facility]  [address], [address] [place], [place] [address] Phone: [phone] Fax : [phone]  Primary Care Physician:  Vonita Moss, MD Primary Gastroenterologist:  Dr. Servando Snare  Pre-Procedure History & Physical: HPI:  Valerie Goodman is a 55 y.o. female is here for a screening colonoscopy.   Past Medical History  Diagnosis Date  . HCV (hepatitis C virus)   . Low back pain   . Cervical pain   . Menopause   . Depression   . Anxiety   . Asthma   . Abnormal weight gain   . Bipolar 1 disorder (HCC)   . Chronic urticaria   . COPD (chronic obstructive pulmonary disease) (HCC)   . Mood disorder (HCC)   . Hypertension   . Stress fracture   . Complication of anesthesia     wakes up during procedures, hard to sedate    Past Surgical History  Procedure Laterality Date  . Tubal ligation      Prior to Admission medications   Medication Sig Start Date End Date Taking? Authorizing Provider  albuterol (PROVENTIL HFA;VENTOLIN HFA) 108 (90 BASE) MCG/ACT inhaler Inhale 2 puffs into the lungs every 6 (six) hours as needed for wheezing or shortness of breath.   Yes Historical Provider, MD  b complex vitamins tablet Take 1 tablet by mouth daily.   Yes Historical Provider, MD  budesonide-formoterol (SYMBICORT) 160-4.5 MCG/ACT inhaler Inhale 2 puffs into the lungs 2 (two) times daily.   Yes Historical Provider, MD  busPIRone (BUSPAR) 15 MG tablet Take 15 mg by mouth.   Yes Historical Provider, MD  gabapentin (NEURONTIN) 300 MG capsule Take 300 mg by mouth 3 (three) times daily.   Yes Historical Provider, MD  lisinopril (PRINIVIL,ZESTRIL) 10 MG tablet Take 10 mg by mouth daily.   Yes Historical Provider, MD  Multiple Vitamin (MULTIVITAMIN) tablet Take 1 tablet by mouth daily.   Yes Historical Provider, MD  sertraline (ZOLOFT) 100 MG tablet Take 100 mg by mouth daily.   Yes Historical Provider, MD  alendronate (FOSAMAX) 70 MG tablet Take 1 tablet (70 mg total) by mouth once a  week. Take with a full glass of water on an empty stomach. Patient not taking: Reported on 09/12/2015 09/10/15   Gabriel Cirri, NP    Allergies as of 09/02/2015 - Review Complete 08/25/2015  Allergen Reaction Noted  . Mobic [meloxicam] Swelling 07/07/2015    Family History  Problem Relation Age of Onset  . Cancer Mother     uterine  . Arthritis Mother   . Breast cancer Mother 32  . Cancer Father   . Mental illness Father     bipolar  . Hypertension Son   . Asthma Son   . Polymyositis Maternal Grandmother   . Cancer Maternal Grandfather   . Heart disease Paternal Grandmother     MI  . Cancer Paternal Grandfather   . Cancer Son     melanoma  . Breast cancer Maternal Aunt 52    55    Social History   Social History  . Marital Status: Married    Spouse Name: N/A  . Number of Children: N/A  . Years of Education: N/A   Occupational History  . Not on file.   Social History Main Topics  . Smoking status: Current Every Day Smoker -- 1.00 packs/day    Types: Cigarettes  . Smokeless tobacco: Never Used  . Alcohol Use: 15.0 oz/week    25 Glasses of wine, 0 Standard  drinks or equivalent per week     Comment: on occasion  . Drug Use: No  . Sexual Activity: Yes   Other Topics Concern  . Not on file   Social History Narrative    Review of Systems: See HPI, otherwise negative ROS  Physical Exam: BP 174/93 mmHg  Pulse 73  Temp(Src) 98.6 F (37 C) (Temporal)  Resp 16  Ht  (1.6 m)  Wt 130 lb (58.968 kg)  BMI 23.03 kg/m2  SpO2 97%  LMP  (LMP Unknown) General:   Alert,  pleasant and cooperative in NAD Head:  Normocephalic and atraumatic. Neck:  Supple; no masses or thyromegaly. Lungs:  Clear throughout to auscultation.    Heart:  Regular rate and rhythm. Abdomen:  Soft, nontender and nondistended. Normal bowel sounds, without guarding, and without rebound.   Neurologic:  Alert and  oriented x4;  grossly normal neurologically.  Impression/Plan: Valerie Goodman is now here to undergo a screening colonoscopy.  Risks, benefits, and alternatives regarding colonoscopy have been reviewed with the patient.  Questions have been answered.  All parties agreeable.

## 2015-09-12 NOTE — Anesthesia Postprocedure Evaluation (Signed)
Anesthesia Post Note  Patient: Valerie Goodman  Procedure(s) Performed: Procedure(s) (LRB): COLONOSCOPY WITH PROPOFOL (N/A)  Patient location during evaluation: PACU Anesthesia Type: MAC Level of consciousness: awake and alert Pain management: pain level controlled Vital Signs Assessment: post-procedure vital signs reviewed and stable Respiratory status: spontaneous breathing, nonlabored ventilation, respiratory function stable and patient connected to nasal cannula oxygen Cardiovascular status: stable and blood pressure returned to baseline Anesthetic complications: no    Zaeden Lastinger

## 2015-09-15 ENCOUNTER — Encounter: Payer: Self-pay | Admitting: Gastroenterology

## 2015-09-19 ENCOUNTER — Other Ambulatory Visit: Payer: Self-pay | Admitting: Unknown Physician Specialty

## 2015-09-19 DIAGNOSIS — R928 Other abnormal and inconclusive findings on diagnostic imaging of breast: Secondary | ICD-10-CM

## 2015-11-14 ENCOUNTER — Ambulatory Visit
Admission: RE | Admit: 2015-11-14 | Discharge: 2015-11-14 | Disposition: A | Discharge status: Home / Self Care | Payer: BLUE CROSS/BLUE SHIELD | Source: Ambulatory Visit | Attending: Unknown Physician Specialty | Admitting: Unknown Physician Specialty

## 2015-11-14 DIAGNOSIS — R928 Other abnormal and inconclusive findings on diagnostic imaging of breast: Secondary | ICD-10-CM

## 2016-01-21 ENCOUNTER — Telehealth: Payer: Self-pay | Admitting: Gastroenterology

## 2016-01-21 NOTE — Telephone Encounter (Signed)
Noted! Thank you

## 2016-01-21 NOTE — Telephone Encounter (Signed)
Patient called to let Ginger know that she would be getting her medication Thursday and will start it Friday evening. She found a foundation to help her pay for it.

## 2016-02-02 ENCOUNTER — Other Ambulatory Visit: Payer: Self-pay

## 2016-02-02 ENCOUNTER — Telehealth: Payer: Self-pay

## 2016-02-02 MED ORDER — ONDANSETRON 8 MG PO TBDP: 8.0000 mg | ORAL_TABLET | Freq: Three times a day (TID) | ORAL | Status: DC | PRN

## 2016-02-02 NOTE — Telephone Encounter (Signed)
Advised pt Dr. Servando SnareWohl will not write her out of work for 12 weeks for her due to pt just starting taking Hep C medication only 1 week ago and symptoms like this are common when you start a new viral medication. Pt stated she is unable to do her job because of the bad headaches, nausea and fatigue. I recommend her taking something otc for the headache and the insomnia. A verbal order was given by Dr. Servando SnareWohl to send Zofran to her pharmacy to help with nausea. Advised her to have [facility] fax me the paperwork.

## 2016-02-02 NOTE — Telephone Encounter (Signed)
Patient called in and explained that she is having Insomnia, Severe Headaches, Itching, and Fatigue since starting Hepatitis C treatment 9 days ago. She is requesting a leave of absence from her job due to side effects from medication. I explained that this will most likely not be granted as we do not place patient's on leave for side effects to medication but she would like to talk to Ginger to confirm this information.

## 2016-02-03 ENCOUNTER — Telehealth: Payer: Self-pay | Admitting: Gastroenterology

## 2016-02-03 NOTE — Telephone Encounter (Signed)
Spoke with pt regarding her missed morning Hep C medication. Advised pt she is still okay but she needs to go ahead and take it as soon as possible. I asked her if she had picked up her Zofran that I had sent to the pharmacy to help with her nausea and vomiting and she stated not yet. Advised [facility] then take her Hep C medication.

## 2016-02-03 NOTE — Telephone Encounter (Signed)
Week 2 of Hep C treatment. She got up this morning with a headache and dry heaves. She missed her morning dose. What should she do?

## 2016-02-09 ENCOUNTER — Other Ambulatory Visit: Payer: Self-pay

## 2016-02-12 ENCOUNTER — Telehealth: Payer: Self-pay

## 2016-02-12 NOTE — Telephone Encounter (Signed)
Tabby from [facility] called for the following questions regarding medication (I did not understand which one):  1. Does the patient have cirrhosis? 2. If yes, does the patient have child C or Q class liver disease?  3. If no, how many weeks of medication has the patient already received?   Contact name: Tabby Contact number: [phone] Reference ID #: [id number] Deadline: Tabby needs to enter this information by the end of the business day 02/13/2016

## 2016-02-12 NOTE — Telephone Encounter (Signed)
Please call Valerie Goodman back and inform her of this information please. Thank you!  Pt doesn't have cirrhosis so no class has been established. She currently has been on her medication for 3 weeks.

## 2016-03-04 ENCOUNTER — Telehealth: Payer: Self-pay | Admitting: Gastroenterology

## 2016-03-04 NOTE — Telephone Encounter (Signed)
Patient wants to return to work 8/14 and needs a return to work note. I explained to her that you were out of the office until the 14th. Please call.

## 2016-03-08 ENCOUNTER — Other Ambulatory Visit: Payer: Self-pay

## 2016-03-08 NOTE — Telephone Encounter (Signed)
Work note completed and put up front for patient to pick up.

## 2016-04-22 ENCOUNTER — Telehealth: Payer: Self-pay | Admitting: Gastroenterology

## 2016-04-22 NOTE — Telephone Encounter (Signed)
Patient finished HEP C treatment, would like to know what the next step is. Please call and advise.

## 2016-04-23 NOTE — Telephone Encounter (Signed)
Pt scheduled for a follow up appt with Dr. Servando SnareWohl on Oct 16th for Hep C treatment completed.

## 2016-05-07 ENCOUNTER — Other Ambulatory Visit: Payer: Self-pay

## 2016-05-10 ENCOUNTER — Encounter: Payer: BLUE CROSS/BLUE SHIELD | Admitting: Gastroenterology

## 2016-06-03 ENCOUNTER — Ambulatory Visit (INDEPENDENT_AMBULATORY_CARE_PROVIDER_SITE_OTHER): Payer: BLUE CROSS/BLUE SHIELD | Admitting: Gastroenterology

## 2016-06-03 ENCOUNTER — Encounter: Payer: Self-pay | Admitting: Gastroenterology

## 2016-06-03 VITALS — BP 127/87 | HR 86 | Temp 98.7°F | Ht 63.0 in | Wt 138.0 lb

## 2016-06-03 DIAGNOSIS — B182 Chronic viral hepatitis C: Secondary | ICD-10-CM

## 2016-06-03 NOTE — Progress Notes (Signed)
Primary Care Physician: Vonita MossMark Crissman, MD  Primary Gastroenterologist:  Dr. Midge Miniumarren Earnie Goodman  Chief Complaint  Patient presents with  . Hepatitis C    Treatment completed    HPI: Valerie Goodman is a 55 y.o. female here for follow up after finishing her treatment for HCV. The patient reports that during the treatment she had a lot of symptoms and side effects. The patient states that she feels much better now that she has stopped it. There is no report of any fevers chills nausea vomiting black stools or bloody stools.  Current Outpatient Prescriptions  Medication Sig Dispense Refill  . albuterol (PROVENTIL HFA;VENTOLIN HFA) 108 (90 BASE) MCG/ACT inhaler Inhale 2 puffs into the lungs every 6 (six) hours as needed for wheezing or shortness of breath.    . budesonide-formoterol (SYMBICORT) 160-4.5 MCG/ACT inhaler Inhale 2 puffs into the lungs 2 (two) times daily.    Marland Kitchen. lisinopril (PRINIVIL,ZESTRIL) 10 MG tablet Take 10 mg by mouth daily.    Marland Kitchen. LORazepam (ATIVAN) 0.5 MG tablet Take 0.5 mg by mouth every 8 (eight) hours.    . Multiple Vitamin (MULTIVITAMIN) tablet Take 1 tablet by mouth daily.    Marland Kitchen. rOPINIRole (REQUIP) 0.5 MG tablet Take 0.5 mg by mouth daily.    . sertraline (ZOLOFT) 100 MG tablet Take 100 mg by mouth daily.    Marland Kitchen. alendronate (FOSAMAX) 70 MG tablet Take 1 tablet (70 mg total) by mouth once a week. Take with a full glass of water on an empty stomach. (Patient not taking: Reported on 06/03/2016) 12 tablet 3  . b complex vitamins tablet Take 1 tablet by mouth daily.    . ondansetron (ZOFRAN ODT) 8 MG disintegrating tablet Take 1 tablet (8 mg total) by mouth every 8 (eight) hours as needed for nausea or vomiting. (Patient not taking: Reported on 06/03/2016) 20 tablet 3   No current facility-administered medications for this visit.     Allergies as of 06/03/2016 - Review Complete 06/03/2016  Allergen Reaction Noted  . Mobic [meloxicam] Swelling 07/07/2015    ROS:  General: Negative  for anorexia, weight loss, fever, chills, fatigue, weakness. ENT: Negative for hoarseness, difficulty swallowing , nasal congestion. CV: Negative for chest pain, angina, palpitations, dyspnea on exertion, peripheral edema.  Respiratory: Negative for dyspnea at rest, dyspnea on exertion, cough, sputum, wheezing.  GI: See history of present illness. GU:  Negative for dysuria, hematuria, urinary incontinence, urinary frequency, nocturnal urination.  Endo: Negative for unusual weight change.    Physical Examination:   BP 127/87   Pulse 86   Temp 98.7 F (37.1 C) (Oral)   Ht 5\' 3"  (1.6 m)   Wt 138 lb (62.6 kg)   LMP  (LMP Unknown)   BMI 24.45 kg/m   General: Well-nourished, well-developed in no acute distress.  Eyes: No icterus. Conjunctivae pink. Mouth: Oropharyngeal mucosa moist and pink , no lesions erythema or exudate. Lungs: Clear to auscultation bilaterally. Non-labored. Heart: Regular rate and rhythm, no murmurs rubs or gallops.  Abdomen: Bowel sounds are normal, nontender, nondistended, no hepatosplenomegaly or masses, no abdominal bruits or hernia , no rebound or guarding.   Extremities: No lower extremity edema. No clubbing or deformities. Neuro: Alert and oriented x 3.  Grossly intact. Skin: Warm and dry, no jaundice.   Psych: Alert and cooperative, normal mood and affect.  Labs:    Imaging Studies: No results found.  Assessment and Plan:   Valerie Goodman is a 55 y.o. y/o female  who completed treatment for her hepatitis C. The patient was on 12 weeks of treatment. The patient has been doing well since stopping the medication despite having a lot of side effects while she was on the medication. The patient will have her labs checked today to make sure that her hepatitis C is gone and she will be checked again in 6 months and a year. The patient has been explained the plan and agrees with it    Valerie Miniumarren Elroy Schembri, MD. Clementeen GrahamFACG   Note: This dictation was prepared with Dragon  dictation along with smaller phrase technology. Any transcriptional errors that result from this process are unintentional.

## 2016-06-07 ENCOUNTER — Encounter: Payer: BLUE CROSS/BLUE SHIELD | Admitting: Gastroenterology

## 2016-06-08 ENCOUNTER — Telehealth: Payer: Self-pay

## 2016-06-08 NOTE — Telephone Encounter (Signed)
Tried contacting pt but vm box was full. No able to leave message.

## 2016-06-08 NOTE — Telephone Encounter (Signed)
-----   Message from Midge Miniumarren Wohl, MD sent at 06/07/2016  1:33 PM EST ----- Let the patient know that know that her liver enzymes are back to normal. We are still waiting for the viral load to come back.

## 2016-06-15 NOTE — Telephone Encounter (Signed)
Pt notified of lab results

## 2016-06-15 NOTE — Telephone Encounter (Signed)
Tried contacting pt again but vm box was full. Not able to leave message.

## 2016-06-21 ENCOUNTER — Telehealth: Payer: Self-pay

## 2016-06-21 LAB — HCV RT-PCR, QUANT (NON-GRAPH)

## 2016-06-21 LAB — HEPATIC FUNCTION PANEL
ALK PHOS: 113 IU/L (ref 39–117)
ALT: 15 IU/L (ref 0–32)
AST: 19 IU/L (ref 0–40)
Albumin: 4.5 g/dL (ref 3.5–5.5)
BILIRUBIN, DIRECT: 0.05 mg/dL (ref 0.00–0.40)
Total Protein: 7.2 g/dL (ref 6.0–8.5)

## 2016-06-21 NOTE — Telephone Encounter (Signed)
-----   Message from Midge Miniumarren Wohl, MD sent at 06/21/2016  1:06 PM EST ----- Patient know that the hepatitis C virus was negative and her blood test.

## 2016-06-21 NOTE — Telephone Encounter (Signed)
Tried contacting pt but vm was not set up. Could not leave message.  

## 2016-06-21 NOTE — Telephone Encounter (Signed)
Pt notified of lab results. Pt will be added to 6 months recall list.

## 2016-06-21 NOTE — Telephone Encounter (Signed)
-----   Message from Darren Wohl, MD sent at 06/21/2016  1:06 PM EST ----- Patient know that the hepatitis C virus was negative and her blood test. 

## 2016-10-12 ENCOUNTER — Telehealth: Payer: Self-pay

## 2016-10-12 NOTE — Telephone Encounter (Signed)
Patient called to see if Valerie Goodman had an appointment.  Patient can't take the Fosamax. Makes her extremely nauseated.  She also thinks she has a stress fractures again.   I called and set up an appointment for 10/18/2016 at 2:30.

## 2016-10-12 NOTE — Telephone Encounter (Signed)
Routing to provider, FYI. Patient coming in Monday for an appointment.

## 2016-10-18 ENCOUNTER — Ambulatory Visit
Admission: RE | Admit: 2016-10-18 | Discharge: 2016-10-18 | Disposition: A | Discharge status: Home / Self Care | Payer: BLUE CROSS/BLUE SHIELD | Source: Ambulatory Visit | Attending: Unknown Physician Specialty | Admitting: Unknown Physician Specialty

## 2016-10-18 ENCOUNTER — Ambulatory Visit (INDEPENDENT_AMBULATORY_CARE_PROVIDER_SITE_OTHER): Payer: BLUE CROSS/BLUE SHIELD | Admitting: Unknown Physician Specialty

## 2016-10-18 ENCOUNTER — Encounter: Payer: Self-pay | Admitting: Unknown Physician Specialty

## 2016-10-18 VITALS — BP 148/92 | HR 86 | Temp 98.6°F | Ht 63.0 in | Wt 148.9 lb

## 2016-10-18 DIAGNOSIS — Z23 Encounter for immunization: Secondary | ICD-10-CM

## 2016-10-18 DIAGNOSIS — M546 Pain in thoracic spine: Secondary | ICD-10-CM

## 2016-10-18 DIAGNOSIS — M810 Age-related osteoporosis without current pathological fracture: Secondary | ICD-10-CM | POA: Diagnosis not present

## 2016-10-18 MED ORDER — IBANDRONATE SODIUM 150 MG PO TABS: 150.0000 mg | ORAL_TABLET | ORAL | 4 refills | Status: DC

## 2016-10-18 NOTE — Assessment & Plan Note (Signed)
Unable to tolerate Fossamax.  Will start Boniva.  Consider infusions if not tolerated.

## 2016-10-18 NOTE — Patient Instructions (Signed)

## 2016-10-18 NOTE — Progress Notes (Signed)
BP (!) 148/92 (BP Location: Left Arm, Cuff Size: Normal)   Pulse 86   Temp 98.6 F (37 C)   Ht 5\' 3"  (1.6 m)   Wt 148 lb 14.4 oz (67.5 kg)   LMP  (LMP Unknown)   SpO2 99%   BMI 26.38 kg/m    Subjective:    Patient ID: Valerie Goodman, female    DOB: 1960/08/02, 56 y.o.   MRN: [id number]  HPI: Valerie Goodman is a 56 y.o. female  Chief Complaint  Patient presents with  . Back Pain    pt states that she cannot take fosamax because it makes her nauseous, states she thinks she has a stress fracture   Back pain Pt is having pain mid back and it feels like her "spine is compacted."  This has been progressively worsening for about 1 month.  She has known osteoporosis and stopped during her hep C treatment.  Fossamax makes her nauseated.  Last bone density reviewed with a T score of -2.6  Relevant past medical, surgical, family and social history reviewed and updated as indicated. Interim medical history since our last visit reviewed. Allergies and medications reviewed and updated.  Review of Systems  Per HPI unless specifically indicated above     Objective:    BP (!) 148/92 (BP Location: Left Arm, Cuff Size: Normal)   Pulse 86   Temp 98.6 F (37 C)   Ht 5\' 3"  (1.6 m)   Wt 148 lb 14.4 oz (67.5 kg)   LMP  (LMP Unknown)   SpO2 99%   BMI 26.38 kg/m   Wt Readings from Last 3 Encounters:  10/18/16 148 lb 14.4 oz (67.5 kg)  06/03/16 138 lb (62.6 kg)  09/12/15 130 lb (59 kg)    Physical Exam  Constitutional: She is oriented to person, place, and time. She appears well-developed and well-nourished. No distress.  HENT:  Head: Normocephalic and atraumatic.  Eyes: Conjunctivae and lids are normal. Right eye exhibits no discharge. Left eye exhibits no discharge. No scleral icterus.  Neck: Normal range of motion. Neck supple. No JVD present. Carotid bruit is not present.  Cardiovascular: Normal rate, regular rhythm and normal heart sounds.   Pulmonary/Chest: Effort normal and  breath sounds normal.  Abdominal: Normal appearance. There is no splenomegaly or hepatomegaly.  Musculoskeletal: Normal range of motion.       Thoracic back: She exhibits bony tenderness and pain. She exhibits normal range of motion and no swelling.  Neurological: She is alert and oriented to person, place, and time.  Skin: Skin is warm, dry and intact. No rash noted. No pallor.  Psychiatric: She has a normal mood and affect. Her behavior is normal. Judgment and thought content normal.    Results for orders placed or performed in visit on 06/03/16  HCV RT-PCR, Quant (Non-Graph)  Result Value Ref Range   HCV Quantitative Comment Copies/mL   HCV Quantitative Log CANCELED Log10copy/mL   HCV QUANTITATIVE BASELINE Less than 2 IU/mL. IU/mL   IU log10 CANCELED Log10 IU/mL   PCR AMPLIFICATION+DETECTION Comment    TEST INFORMATION Comment   Hepatic function panel  Result Value Ref Range   Total Protein 7.2 6.0 - 8.5 g/dL   Albumin 4.5 3.5 - 5.5 g/dL   Bilirubin Total <4.0 0.0 - 1.2 mg/dL   Bilirubin, Direct 9.81 0.00 - 0.40 mg/dL   Alkaline Phosphatase 113 39 - 117 IU/L   AST 19 0 - 40 IU/L   ALT  15 0 - 32 IU/L      Assessment & Plan:   Problem List Items Addressed This Visit      Unprioritized   Osteoporosis    Unable to tolerate Fossamax.  Will start Boniva.  Consider infusions if not tolerated.        Relevant Medications   ibandronate (BONIVA) 150 MG tablet    Other Visit Diagnoses    Need for influenza vaccination    -  Primary   Relevant Orders   Flu Vaccine QUAD 36+ mos IM (Completed)   Midline thoracic back pain, unspecified chronicity       Relevant Orders   DG Thoracic Spine W/Swimmers       Follow up plan: Return for physical.

## 2016-10-19 ENCOUNTER — Encounter: Payer: BLUE CROSS/BLUE SHIELD | Admitting: Unknown Physician Specialty

## 2016-11-02 ENCOUNTER — Encounter: Payer: Self-pay | Admitting: Unknown Physician Specialty

## 2016-11-02 ENCOUNTER — Ambulatory Visit: Payer: BLUE CROSS/BLUE SHIELD | Admitting: Unknown Physician Specialty

## 2016-11-02 ENCOUNTER — Ambulatory Visit (INDEPENDENT_AMBULATORY_CARE_PROVIDER_SITE_OTHER): Payer: BLUE CROSS/BLUE SHIELD | Admitting: Unknown Physician Specialty

## 2016-11-02 DIAGNOSIS — R079 Chest pain, unspecified: Secondary | ICD-10-CM

## 2016-11-02 DIAGNOSIS — I10 Essential (primary) hypertension: Secondary | ICD-10-CM

## 2016-11-02 DIAGNOSIS — M810 Age-related osteoporosis without current pathological fracture: Secondary | ICD-10-CM | POA: Diagnosis not present

## 2016-11-02 MED ORDER — METOPROLOL SUCCINATE ER 50 MG PO TB24: 50.0000 mg | ORAL_TABLET | Freq: Every day | ORAL | 1 refills | Status: DC

## 2016-11-02 NOTE — Assessment & Plan Note (Addendum)
Managing Boniva well.  Continue monthly dosing

## 2016-11-02 NOTE — Assessment & Plan Note (Addendum)
Unstable.  With hypertensive urgency in the ER.  Not taking Lisinopril due to side-effects.  Will change to Metoprolol 50 mg daily.

## 2016-11-02 NOTE — Progress Notes (Signed)
BP (!) 148/94 (BP Location: Left Arm, Cuff Size: Normal)   Pulse 84   Temp 97.9 F (36.6 C)   Wt 146 lb 12.8 oz (66.6 kg)   LMP  (LMP Unknown)   SpO2 97%   BMI 26.00 kg/m    Subjective:    Patient ID: [name], female    DOB: 12/04/1960, 56 y.o.   MRN: [id number]  HPI: Valerie Goodman is a 56 y.o. female  Chief Complaint  Patient presents with  . ER Follow up    pt states she was seen at [facility] because she thought she was having a heart attack and hypertension   Pt went to the ER with chest pain and elevated BP.  Imaging results were normal.  She was discharged on HCTZ and was supposed to to take Lisinopril but is irregular about taking it.   I reviewed the ER note noting and elevated BP and results of all tests.  EKG showing some slight ST depressions V4-V6.      BP still elevated with the additional HCTZ but not taking prescribed Lisinopril.  States Lisinopril makes her feel groggy so takes occasionally at night.  Occasional palpitations.  Noted even at night her BP is up in the 80's.  No swelling SOB, or current chest pain.    Osteoporosis - managing Boniva without incidence  She sees a psychiatrist, Dr. Janeece Riggers for depression and insomnia who is managing Zoloft and Lorazepam. She feels she has stress but anxiety is stable.    Relevant past medical, surgical, family and social history reviewed and updated as indicated. Interim medical history since our last visit reviewed. Allergies and medications reviewed and updated.  Review of Systems  Per HPI unless specifically indicated above     Objective:    BP (!) 148/94 (BP Location: Left Arm, Cuff Size: Normal)   Pulse 84   Temp 97.9 F (36.6 C)   Wt 146 lb 12.8 oz (66.6 kg)   LMP  (LMP Unknown)   SpO2 97%   BMI 26.00 kg/m   Wt Readings from Last 3 Encounters:  11/02/16 146 lb 12.8 oz (66.6 kg)  10/18/16 148 lb 14.4 oz (67.5 kg)  06/03/16 138 lb (62.6 kg)    Physical Exam  Constitutional: She is oriented to person,  place, and time. She appears well-developed and well-nourished. No distress.  HENT:  Head: Normocephalic and atraumatic.  Eyes: Conjunctivae and lids are normal. Right eye exhibits no discharge. Left eye exhibits no discharge. No scleral icterus.  Neck: Normal range of motion. Neck supple. No JVD present. Carotid bruit is not present.  Cardiovascular: Normal rate, regular rhythm and normal heart sounds.   Pulmonary/Chest: Effort normal and breath sounds normal.  Abdominal: Normal appearance. There is no splenomegaly or hepatomegaly.  Musculoskeletal: Normal range of motion.  Neurological: She is alert and oriented to person, place, and time.  Skin: Skin is warm, dry and intact. No rash noted. No pallor.  Psychiatric: She has a normal mood and affect. Her behavior is normal. Judgment and thought content normal.    Results for orders placed or performed in visit on 06/03/16  HCV RT-PCR, Quant (Non-Graph)  Result Value Ref Range   HCV Quantitative Comment Copies/mL   HCV Quantitative Log CANCELED Log10copy/mL   HCV QUANTITATIVE BASELINE Less than 2 IU/mL. IU/mL   IU log10 CANCELED Log10 IU/mL   PCR AMPLIFICATION+DETECTION Comment    TEST INFORMATION Comment   Hepatic function panel  Result  Value Ref Range   Total Protein 7.2 6.0 - 8.5 g/dL   Albumin 4.5 3.5 - 5.5 g/dL   Bilirubin Total <1.6 0.0 - 1.2 mg/dL   Bilirubin, Direct 1.09 0.00 - 0.40 mg/dL   Alkaline Phosphatase 113 39 - 117 IU/L   AST 19 0 - 40 IU/L   ALT 15 0 - 32 IU/L      Assessment & Plan:   Problem List Items Addressed This Visit      Unprioritized   Chest pain    No acute changes on EKG but in the ER due to pain.  Will refer to cardiology for further work-up.        Relevant Orders   Ambulatory referral to Cardiology   Hypertension    Unstable.  With hypertensive urgency in the ER.  Not taking Lisinopril due to side-effects.  Will change to Metoprolol 50 mg daily.        Relevant Medications    hydrochlorothiazide (HYDRODIURIL) 25 MG tablet   metoprolol succinate (TOPROL-XL) 50 MG 24 hr tablet   Osteoporosis    Managing Boniva well.  Continue monthly dosing          Follow up plan: Return in about 4 weeks (around 11/30/2016).

## 2016-11-02 NOTE — Assessment & Plan Note (Signed)
No acute changes on EKG but in the ER due to pain.  Will refer to cardiology for further work-up.

## 2016-11-29 ENCOUNTER — Telehealth: Payer: Self-pay

## 2016-11-29 NOTE — Telephone Encounter (Signed)
-----   Message from Miller Edgington El PasoFeldpausch, CMA sent at 06/21/2016  4:41 PM EST ----- Pt needs 6 months repeat on viral load.

## 2016-11-29 NOTE — Telephone Encounter (Signed)
Left vm letting pt know she is due for her 6 months repeat viral load and hepatic function.

## 2016-11-30 ENCOUNTER — Telehealth: Payer: Self-pay | Admitting: Family Medicine

## 2016-11-30 ENCOUNTER — Ambulatory Visit
Admission: RE | Admit: 2016-11-30 | Discharge: 2016-11-30 | Disposition: A | Discharge status: Home / Self Care | Payer: BLUE CROSS/BLUE SHIELD | Source: Ambulatory Visit | Attending: Unknown Physician Specialty | Admitting: Unknown Physician Specialty

## 2016-11-30 ENCOUNTER — Ambulatory Visit (INDEPENDENT_AMBULATORY_CARE_PROVIDER_SITE_OTHER): Payer: BLUE CROSS/BLUE SHIELD | Admitting: Unknown Physician Specialty

## 2016-11-30 ENCOUNTER — Encounter: Payer: Self-pay | Admitting: Unknown Physician Specialty

## 2016-11-30 DIAGNOSIS — M545 Low back pain: Secondary | ICD-10-CM

## 2016-11-30 MED ORDER — DICLOFENAC SODIUM 75 MG PO TBEC: 75.0000 mg | DELAYED_RELEASE_TABLET | Freq: Two times a day (BID) | ORAL | 0 refills | Status: DC

## 2016-11-30 MED ORDER — DICLOFENAC SODIUM 75 MG PO TBEC
75.0000 mg | DELAYED_RELEASE_TABLET | Freq: Two times a day (BID) | ORAL | 0 refills | Status: DC
Start: 2016-11-30 — End: 2017-03-08

## 2016-11-30 MED ORDER — MELOXICAM 15 MG PO TABS: 15.0000 mg | ORAL_TABLET | Freq: Every day | ORAL | 0 refills | Status: DC

## 2016-11-30 MED ORDER — CYCLOBENZAPRINE HCL 10 MG PO TABS: 10.0000 mg | ORAL_TABLET | Freq: Three times a day (TID) | ORAL | 0 refills | Status: DC | PRN

## 2016-11-30 NOTE — Telephone Encounter (Signed)
Patient called in regards to a medication that she was prescribed for today's visit. Patient states that the medication is/contains Mobic (Meloxicam) which she is allergic to. Patient would also like to have all [facility] [place], [place]. Please Advise.   [facility]  [address], [place], [place] [id number] [phone]  Patient contact: [phone]  Thank you.

## 2016-11-30 NOTE — Telephone Encounter (Signed)
[facility] [place].

## 2016-11-30 NOTE — Telephone Encounter (Signed)
Routing to provider  

## 2016-11-30 NOTE — Telephone Encounter (Signed)
Called and spoke to patient. Elnita MaxwellCheryl asked me to ask the patient if she could take ibuprofen and to give the patient the number to [facility] in [place]. Number provided and patient stated she could take ibuprofen. While on the phone, patient asked about her x-ray and I told her that Elnita MaxwellCheryl stated that it was negative. Pharmacy also updated in chart.

## 2016-11-30 NOTE — Telephone Encounter (Signed)
Saw Valerie Goodman.

## 2016-11-30 NOTE — Telephone Encounter (Signed)
Thanks, we will try the Voltaren I prescribed.  Thanks

## 2016-11-30 NOTE — Assessment & Plan Note (Addendum)
Pt having a significant flare but this has been a problem in the past.  I will refer to PT.  Prescription for muscle relaxant.  Out of work for 2 weeks.

## 2016-11-30 NOTE — Progress Notes (Signed)
BP (!) 168/103 (BP Location: Left Arm, Cuff Size: Normal)   Pulse (!) 112   Temp 99.7 F (37.6 C)   Wt 145 lb 12.8 oz (66.1 kg)   LMP  (LMP Unknown)   SpO2 97%   BMI 25.83 kg/m    Subjective:    Patient ID: Valerie Goodman, female    DOB: June 10, 1961, 56 y.o.   MRN: [id number]  HPI: Valerie Goodman is a 56 y.o. female  Chief Complaint  Patient presents with  . Pain    pt states she has been having lower back and hip pain for a few weeks now, states she has been doing a lot of pulling and lifting at work    Back Pain  This is a new problem. Episode onset: 6 weeks. The problem occurs constantly. The problem has been gradually worsening since onset. The pain is present in the lumbar spine. The quality of the pain is described as aching and stabbing. The pain radiates to the left knee. The pain is at a severity of 8/10. The pain is severe. The pain is the same all the time. The symptoms are aggravated by sitting. Pertinent negatives include no bladder incontinence, dysuria, fever, numbness or weakness. Risk factors include history of osteoporosis. She has tried analgesics, heat and NSAIDs (TENS unit) for the symptoms. The treatment provided mild relief.   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  On feet all day and stocking, climbing, pushing, pulling, lifting.  She feels she cannot do her job at the present time.    Relevant past medical, surgical, family and social history reviewed and updated as indicated. Interim medical history since our last visit reviewed. Allergies and medications reviewed and updated.  Review of Systems  Constitutional: Negative for fever.  Genitourinary: Negative for bladder incontinence and dysuria.  Musculoskeletal: Positive for back pain.  Neurological: Negative for weakness and numbness.    Per HPI unless specifically indicated above     Objective:    BP (!) 168/103 (BP Location: Left Arm, Cuff Size: Normal)   Pulse (!) 112   Temp 99.7 F (37.6 C)    Wt 145 lb 12.8 oz (66.1 kg)   LMP  (LMP Unknown)   SpO2 97%   BMI 25.83 kg/m   Wt Readings from Last 3 Encounters:  11/30/16 145 lb 12.8 oz (66.1 kg)  11/02/16 146 lb 12.8 oz (66.6 kg)  10/18/16 148 lb 14.4 oz (67.5 kg)    Physical Exam  Constitutional: She is oriented to person, place, and time. She appears well-developed and well-nourished. No distress.  HENT:  Head: Normocephalic and atraumatic.  Eyes: Conjunctivae and lids are normal. Right eye exhibits no discharge. Left eye exhibits no discharge. No scleral icterus.  Cardiovascular: Normal rate.   Pulmonary/Chest: Effort normal.  Abdominal: Normal appearance. There is no splenomegaly or hepatomegaly.  Musculoskeletal:       Lumbar back: She exhibits decreased range of motion and tenderness.  Neurological: She is alert and oriented to person, place, and time.  Skin: Skin is intact. No rash noted. No pallor.  Psychiatric: She has a normal mood and affect. Her behavior is normal. Judgment and thought content normal.      Assessment & Plan:   Problem List Items Addressed This Visit      Unprioritized   Low back pain    Pt having a significant flare but this has been a problem in the past.  I will refer to PT.  Prescription  for muscle relaxant.  Out of work for 2 weeks.        Relevant Medications   cyclobenzaprine (FLEXERIL) 10 MG tablet   meloxicam (MOBIC) 15 MG tablet   Other Relevant Orders   DG Lumbar Spine Complete   UA/M w/rflx Culture, Routine   Ambulatory referral to Physical Therapy       Follow up plan: Return in about 3 weeks (around 12/21/2016).

## 2016-12-01 LAB — UA/M W/RFLX CULTURE, ROUTINE
BILIRUBIN UA: NEGATIVE
GLUCOSE, UA: NEGATIVE
KETONES UA: NEGATIVE
Nitrite, UA: NEGATIVE
Protein, UA: NEGATIVE
SPEC GRAV UA: 1.015 (ref 1.005–1.030)
Urobilinogen, Ur: 0.2 mg/dL (ref 0.2–1.0)
pH, UA: 6 (ref 5.0–7.5)

## 2016-12-01 LAB — MICROSCOPIC EXAMINATION: RBC, UA: NONE SEEN /hpf (ref 0–?)

## 2016-12-02 ENCOUNTER — Telehealth: Payer: Self-pay | Admitting: Family Medicine

## 2016-12-02 ENCOUNTER — Other Ambulatory Visit: Payer: Self-pay

## 2016-12-02 DIAGNOSIS — B182 Chronic viral hepatitis C: Secondary | ICD-10-CM

## 2016-12-02 NOTE — Telephone Encounter (Signed)
Mailed lab order to pt. 

## 2016-12-02 NOTE — Telephone Encounter (Signed)
Patient was seen by Elnita Maxwellheryl on Tuesday and called to see if we had received the [facility] and also needs cheryl to give her something else for pain because the medication she put her on is making her very sick on her stomach, (diclofenac)...  [phone]   Thanks

## 2016-12-03 MED ORDER — TRAMADOL HCL 50 MG PO TABS: 50.0000 mg | ORAL_TABLET | Freq: Three times a day (TID) | ORAL | 0 refills | Status: DC | PRN

## 2016-12-03 NOTE — Telephone Encounter (Signed)
I wrote an rx for Tramadol  [name] and I did fill outt FMLA papers.  But, I cannot tell you where they are.  Up front?  Faxed?

## 2016-12-03 NOTE — Telephone Encounter (Signed)
[facility] [place] ..told patient

## 2016-12-03 NOTE — Telephone Encounter (Signed)
Paperwork has been filled out and placed in to be scanned basket  thanks

## 2016-12-03 NOTE — Telephone Encounter (Signed)
Have you seen paperwork from [employer]? Did not locate in inbox, or Brittany's desk. Please advise on pain medication.

## 2016-12-28 ENCOUNTER — Telehealth: Payer: Self-pay | Admitting: Family Medicine

## 2016-12-28 ENCOUNTER — Ambulatory Visit: Payer: Self-pay | Admitting: Unknown Physician Specialty

## 2016-12-28 NOTE — Telephone Encounter (Signed)
Noted, thanks!

## 2016-12-28 NOTE — Telephone Encounter (Signed)
Patient called to reschedule the appt she had for today. She wanted me to let [name] know the paperwork for today was taken care of at this time.   Just FYI.  Thanks

## 2017-01-03 ENCOUNTER — Ambulatory Visit: Payer: BLUE CROSS/BLUE SHIELD | Admitting: Unknown Physician Specialty

## 2017-01-03 ENCOUNTER — Ambulatory Visit: Payer: BLUE CROSS/BLUE SHIELD | Admitting: Family Medicine

## 2017-01-03 ENCOUNTER — Ambulatory Visit (INDEPENDENT_AMBULATORY_CARE_PROVIDER_SITE_OTHER): Payer: BLUE CROSS/BLUE SHIELD | Admitting: Unknown Physician Specialty

## 2017-01-03 ENCOUNTER — Encounter: Payer: Self-pay | Admitting: Unknown Physician Specialty

## 2017-01-03 DIAGNOSIS — M545 Low back pain: Secondary | ICD-10-CM

## 2017-01-03 NOTE — Assessment & Plan Note (Signed)
Pt states she is 60% improved.  OK to return to work without restrictions as she knows how to best manage her illness

## 2017-01-03 NOTE — Progress Notes (Signed)
   BP (!) 163/111   Pulse 99   Temp 98.6 F (37 C)   Wt 150 lb 9.6 oz (68.3 kg)   LMP  (LMP Unknown)   SpO2 96%   BMI 26.68 kg/m    Subjective:    Patient ID: Valerie Goodman, female    DOB: 1960/12/27, 56 y.o.   MRN: [id number]  HPI: Valerie Goodman is a 56 y.o. female  Chief Complaint  Patient presents with  . Follow-up    back pain f/up, pt states she needs a note to go back to work with no restrictions on 01/05/17   Pt states she is doing well with PT.  See previous note for details.  She states the PT feels like she tore a paraspinal muscle and left piriformis.  She would like to go back to work.    Relevant past medical, surgical, family and social history reviewed and updated as indicated. Interim medical history since our last visit reviewed. Allergies and medications reviewed and updated.  Review of Systems  Per HPI unless specifically indicated above     Objective:    BP (!) 163/111   Pulse 99   Temp 98.6 F (37 C)   Wt 150 lb 9.6 oz (68.3 kg)   LMP  (LMP Unknown)   SpO2 96%   BMI 26.68 kg/m   Wt Readings from Last 3 Encounters:  01/03/17 150 lb 9.6 oz (68.3 kg)  11/30/16 145 lb 12.8 oz (66.1 kg)  11/02/16 146 lb 12.8 oz (66.6 kg)    Physical Exam  Musculoskeletal:       Lumbar back: She exhibits decreased range of motion. She exhibits no tenderness, no bony tenderness, no swelling, no edema and no pain.    Results for orders placed or performed in visit on 11/30/16  Microscopic Examination  Result Value Ref Range   WBC, UA 0-5 0 - 5 /hpf   RBC, UA None seen 0 - 2 /hpf   Epithelial Cells (non renal) 0-10 0 - 10 /hpf   Bacteria, UA Few None seen/Few  UA/M w/rflx Culture, Routine  Result Value Ref Range   Specific Gravity, UA 1.015 1.005 - 1.030   pH, UA 6.0 5.0 - 7.5   Color, UA Yellow Yellow   Appearance Ur Cloudy (A) Clear   Leukocytes, UA Trace (A) Negative   Protein, UA Negative Negative/Trace   Glucose, UA Negative Negative   Ketones, UA  Negative Negative   RBC, UA Trace (A) Negative   Bilirubin, UA Negative Negative   Urobilinogen, Ur 0.2 0.2 - 1.0 mg/dL   Nitrite, UA Negative Negative   Microscopic Examination See below:       Assessment & Plan:   Problem List Items Addressed This Visit      Unprioritized   Low back pain    Pt states she is 60% improved.  OK to return to work without restrictions as she knows how to best manage her illness          Follow up plan: Return if symptoms worsen or fail to improve.

## 2017-02-16 ENCOUNTER — Telehealth: Payer: Self-pay | Admitting: Unknown Physician Specialty

## 2017-02-16 ENCOUNTER — Telehealth: Payer: Self-pay | Admitting: Family Medicine

## 2017-02-16 DIAGNOSIS — M545 Low back pain: Secondary | ICD-10-CM

## 2017-02-16 NOTE — Telephone Encounter (Signed)
Routing to provider for advice.

## 2017-02-16 NOTE — Telephone Encounter (Signed)
Let's refer to orthopedics

## 2017-02-16 NOTE — Telephone Encounter (Signed)
Patient stated she has been to physical therapy for her back but her back has gotten worse since PT. Patient stated PT helped a little. Patient would like to know what she could do in regards to her lower back pain.  Please Advise.  Thank you

## 2017-02-16 NOTE — Telephone Encounter (Signed)
Entered in error

## 2017-02-16 NOTE — Telephone Encounter (Signed)
Called and left patient a VM asking for her to please return my call.  

## 2017-02-17 NOTE — Telephone Encounter (Signed)
Patient returned my call. I let her know that Elnita MaxwellCheryl is referring her to orthopedics and that she should get a call from there office about scheduling an appointment. I asked for her to give us a call if she does not hear anything within a couple of weeks.

## 2017-03-08 ENCOUNTER — Ambulatory Visit (INDEPENDENT_AMBULATORY_CARE_PROVIDER_SITE_OTHER): Payer: BLUE CROSS/BLUE SHIELD | Admitting: Unknown Physician Specialty

## 2017-03-08 ENCOUNTER — Encounter: Payer: Self-pay | Admitting: Unknown Physician Specialty

## 2017-03-08 VITALS — BP 165/105 | HR 79 | Temp 98.3°F | Wt 145.6 lb

## 2017-03-08 DIAGNOSIS — M7662 Achilles tendinitis, left leg: Secondary | ICD-10-CM

## 2017-03-08 DIAGNOSIS — I10 Essential (primary) hypertension: Secondary | ICD-10-CM

## 2017-03-08 NOTE — Patient Instructions (Addendum)

## 2017-03-08 NOTE — Assessment & Plan Note (Signed)
Monitor BP at home. Recheck in month

## 2017-03-08 NOTE — Progress Notes (Signed)
BP (!) 165/105 (BP Location: Left Arm, Cuff Size: Normal)   Pulse 79   Temp 98.3 F (36.8 C)   Wt 145 lb 9.6 oz (66 kg)   LMP  (LMP Unknown)   SpO2 97%   BMI 25.79 kg/m    Subjective:    Patient ID: Valerie Goodman, female    DOB: [birth year], 56 y.o.   MRN: [id number]  HPI: Valerie Goodman is a 56 y.o. female  Chief Complaint  Patient presents with  . Ankle Pain    pt states her left ankle has been hurting and swelling for 4 to 5 days. States it has been getting worse and that it has a burning pain when she is off of it    Ankle pain Pt noting swelling and pain feels like a toothache.  Walking on it makes it worse.  Taking a lot of Ibuprofen.  No known trauma.  No new activity.  Is up on her feet all day on concrete.    Hypertension Noted high BP but not as high at home.  Taking a lot of Ibuprofen Average home BPs: SBP in the 140s   No problems or lightheadedness No chest pain with exertion or shortness of breath No Edema  Relevant past medical, surgical, family and social history reviewed and updated as indicated. Interim medical history since our last visit reviewed. Allergies and medications reviewed and updated.  Review of Systems  Per HPI unless specifically indicated above     Objective:    BP (!) 165/105 (BP Location: Left Arm, Cuff Size: Normal)   Pulse 79   Temp 98.3 F (36.8 C)   Wt 145 lb 9.6 oz (66 kg)   LMP  (LMP Unknown)   SpO2 97%   BMI 25.79 kg/m   Wt Readings from Last 3 Encounters:  03/08/17 145 lb 9.6 oz (66 kg)  01/03/17 150 lb 9.6 oz (68.3 kg)  11/30/16 145 lb 12.8 oz (66.1 kg)    Physical Exam  Constitutional: She is oriented to person, place, and time. She appears well-developed and well-nourished. No distress.  HENT:  Head: Normocephalic and atraumatic.  Eyes: Conjunctivae and lids are normal. Right eye exhibits no discharge. Left eye exhibits no discharge. No scleral icterus.  Neck: Normal range of motion. Neck supple. No JVD  present. Carotid bruit is not present.  Cardiovascular: Normal rate, regular rhythm and normal heart sounds.   Pulmonary/Chest: Effort normal and breath sounds normal.  Abdominal: Normal appearance. There is no splenomegaly or hepatomegaly.  Musculoskeletal:       Left foot: There is decreased range of motion, tenderness and swelling.  Swelling lateral and posterior ankle  Neurological: She is alert and oriented to person, place, and time.  Skin: Skin is warm, dry and intact. No rash noted. No pallor.  Psychiatric: She has a normal mood and affect. Her behavior is normal. Judgment and thought content normal.    Results for orders placed or performed in visit on 11/30/16  Microscopic Examination  Result Value Ref Range   WBC, UA 0-5 0 - 5 /hpf   RBC, UA None seen 0 - 2 /hpf   Epithelial Cells (non renal) 0-10 0 - 10 /hpf   Bacteria, UA Few None seen/Few  UA/M w/rflx Culture, Routine  Result Value Ref Range   Specific Gravity, UA 1.015 1.005 - 1.030   pH, UA 6.0 5.0 - 7.5   Color, UA Yellow Yellow   Appearance Ur Cloudy (A)  Clear   Leukocytes, UA Trace (A) Negative   Protein, UA Negative Negative/Trace   Glucose, UA Negative Negative   Ketones, UA Negative Negative   RBC, UA Trace (A) Negative   Bilirubin, UA Negative Negative   Urobilinogen, Ur 0.2 0.2 - 1.0 mg/dL   Nitrite, UA Negative Negative   Microscopic Examination See below:       Assessment & Plan:   Problem List Items Addressed This Visit      Unprioritized   Hypertension    Monitor BP at home. Recheck in month       Other Visit Diagnoses    Achilles tendinitis of left lower extremity    -  Primary   Achilles tendonitis. Recommend Orthopedics for further managment as may need immobilization.  Continue Ibuprofen.  Out of work for 2 days   Relevant Orders   Ambulatory referral to Orthopedic Surgery       Follow up plan: No Follow-up on file.

## 2017-03-29 ENCOUNTER — Telehealth: Payer: Self-pay

## 2017-03-29 NOTE — Telephone Encounter (Signed)
Received FMLA paperwork via fax for this patient. Thinking it may be for where Valerie MaxwellCheryl wrote her out of work from 03/08/17 to 03/14/17. Patient will need an appointment to have paperwork filled out. Called and left patient a VM asking for her to please return my call.

## 2017-03-29 NOTE — Telephone Encounter (Signed)
Scheduled appt for 9/4 @ 3

## 2017-03-30 ENCOUNTER — Encounter: Payer: Self-pay | Admitting: Unknown Physician Specialty

## 2017-03-30 ENCOUNTER — Ambulatory Visit (INDEPENDENT_AMBULATORY_CARE_PROVIDER_SITE_OTHER): Payer: BLUE CROSS/BLUE SHIELD | Admitting: Unknown Physician Specialty

## 2017-03-30 DIAGNOSIS — M545 Low back pain: Secondary | ICD-10-CM

## 2017-03-30 DIAGNOSIS — M7662 Achilles tendinitis, left leg: Secondary | ICD-10-CM | POA: Diagnosis not present

## 2017-03-30 NOTE — Progress Notes (Signed)
BP (!) 163/97 (BP Location: Left Arm, Cuff Size: Normal)   Pulse 89   Temp 98.8 F (37.1 C)   Wt 142 lb 12.8 oz (64.8 kg)   LMP  (LMP Unknown)   SpO2 96%   BMI 25.30 kg/m    Subjective:    Patient ID: Valerie Goodman, female    DOB: 04-21-1961, 56 y.o.   MRN: [id number]  HPI: Valerie Goodman is a 56 y.o. female  Chief Complaint  Patient presents with  . FMLA Paperwork   Achilles tendonitis - Seeing [facility] for her Achilles tendonitis and back pain.  She has been to PT and soaking and resting tendon.   MRI is pending.  She has gotten no information on when she can return to work.     Wants FMLA paperwork to be out August the 14th to October 8th.    Relevant past medical, surgical, family and social history reviewed and updated as indicated. Interim medical history since our last visit reviewed. Allergies and medications reviewed and updated.  Review of Systems  Per HPI unless specifically indicated above     Objective:    BP (!) 163/97 (BP Location: Left Arm, Cuff Size: Normal)   Pulse 89   Temp 98.8 F (37.1 C)   Wt 142 lb 12.8 oz (64.8 kg)   LMP  (LMP Unknown)   SpO2 96%   BMI 25.30 kg/m   Wt Readings from Last 3 Encounters:  03/30/17 142 lb 12.8 oz (64.8 kg)  03/08/17 145 lb 9.6 oz (66 kg)  01/03/17 150 lb 9.6 oz (68.3 kg)    Physical Exam  Constitutional: She is oriented to person, place, and time. She appears well-developed and well-nourished. No distress.  HENT:  Head: Normocephalic and atraumatic.  Eyes: Conjunctivae and lids are normal. Right eye exhibits no discharge. Left eye exhibits no discharge. No scleral icterus.  Cardiovascular: Normal rate.   Pulmonary/Chest: Effort normal.  Abdominal: Normal appearance. There is no splenomegaly or hepatomegaly.  Musculoskeletal: Normal range of motion.  Neurological: She is alert and oriented to person, place, and time.  Skin: Skin is intact. No rash noted. No pallor.  Psychiatric: She has a normal mood  and affect. Her behavior is normal. Judgment and thought content normal.    Results for orders placed or performed in visit on 11/30/16  Microscopic Examination  Result Value Ref Range   WBC, UA 0-5 0 - 5 /hpf   RBC, UA None seen 0 - 2 /hpf   Epithelial Cells (non renal) 0-10 0 - 10 /hpf   Bacteria, UA Few None seen/Few  UA/M w/rflx Culture, Routine  Result Value Ref Range   Specific Gravity, UA 1.015 1.005 - 1.030   pH, UA 6.0 5.0 - 7.5   Color, UA Yellow Yellow   Appearance Ur Cloudy (A) Clear   Leukocytes, UA Trace (A) Negative   Protein, UA Negative Negative/Trace   Glucose, UA Negative Negative   Ketones, UA Negative Negative   RBC, UA Trace (A) Negative   Bilirubin, UA Negative Negative   Urobilinogen, Ur 0.2 0.2 - 1.0 mg/dL   Nitrite, UA Negative Negative   Microscopic Examination See below:       Assessment & Plan:   Problem List Items Addressed This Visit      Unprioritized   Low back pain    Pt states this is better.  Encouraged yoga for back pain.  MRI is pending  Tendonitis, Achilles, left    Improving.  Under care of orthopedics         FMLA papers filled out  Follow up plan: Return if symptoms worsen or fail to improve.

## 2017-03-30 NOTE — Assessment & Plan Note (Addendum)
Pt states this is better.  Encouraged yoga for back pain.  MRI is pending

## 2017-03-30 NOTE — Assessment & Plan Note (Addendum)
Improving.  Under care of orthopedics

## 2017-04-19 ENCOUNTER — Ambulatory Visit (INDEPENDENT_AMBULATORY_CARE_PROVIDER_SITE_OTHER): Payer: BLUE CROSS/BLUE SHIELD | Admitting: Unknown Physician Specialty

## 2017-04-19 ENCOUNTER — Encounter: Payer: Self-pay | Admitting: Unknown Physician Specialty

## 2017-04-19 VITALS — BP 163/108 | HR 91 | Temp 98.9°F | Ht 62.7 in | Wt 143.7 lb

## 2017-04-19 DIAGNOSIS — G8929 Other chronic pain: Secondary | ICD-10-CM | POA: Diagnosis not present

## 2017-04-19 DIAGNOSIS — Z0000 Encounter for general adult medical examination without abnormal findings: Secondary | ICD-10-CM

## 2017-04-19 DIAGNOSIS — H1013 Acute atopic conjunctivitis, bilateral: Secondary | ICD-10-CM

## 2017-04-19 DIAGNOSIS — M549 Dorsalgia, unspecified: Secondary | ICD-10-CM

## 2017-04-19 DIAGNOSIS — I10 Essential (primary) hypertension: Secondary | ICD-10-CM | POA: Diagnosis not present

## 2017-04-19 DIAGNOSIS — M545 Low back pain: Secondary | ICD-10-CM

## 2017-04-19 DIAGNOSIS — F419 Anxiety disorder, unspecified: Secondary | ICD-10-CM | POA: Diagnosis not present

## 2017-04-19 DIAGNOSIS — F329 Major depressive disorder, single episode, unspecified: Secondary | ICD-10-CM | POA: Diagnosis not present

## 2017-04-19 DIAGNOSIS — H1010 Acute atopic conjunctivitis, unspecified eye: Secondary | ICD-10-CM | POA: Insufficient documentation

## 2017-04-19 LAB — MICROSCOPIC EXAMINATION
BACTERIA UA: NONE SEEN
RBC, UA: NONE SEEN /hpf (ref 0–?)

## 2017-04-19 LAB — UA/M W/RFLX CULTURE, ROUTINE
BILIRUBIN UA: NEGATIVE
Glucose, UA: NEGATIVE
Ketones, UA: NEGATIVE
LEUKOCYTES UA: NEGATIVE
Nitrite, UA: NEGATIVE
PH UA: 5.5 (ref 5.0–7.5)
PROTEIN UA: NEGATIVE
SPEC GRAV UA: 1.015 (ref 1.005–1.030)
Urobilinogen, Ur: 0.2 mg/dL (ref 0.2–1.0)

## 2017-04-19 MED ORDER — DULOXETINE HCL 60 MG PO CPEP: 60.0000 mg | ORAL_CAPSULE | Freq: Every day | ORAL | 3 refills | Status: DC

## 2017-04-19 MED ORDER — LOSARTAN POTASSIUM-HCTZ 100-25 MG PO TABS: 1.0000 | ORAL_TABLET | Freq: Every day | ORAL | 3 refills | Status: DC

## 2017-04-19 MED ORDER — AZELASTINE HCL 0.05 % OP SOLN: 1.0000 [drp] | Freq: Two times a day (BID) | OPHTHALMIC | 12 refills | Status: DC

## 2017-04-19 NOTE — Assessment & Plan Note (Signed)
Change from Sertraline to Duloxetine for back and depression

## 2017-04-19 NOTE — Progress Notes (Signed)
BP (!) 163/108 (BP Location: Left Arm, Cuff Size: Normal)   Pulse 91   Temp 98.9 F (37.2 C)   Ht 5' 2.7" (1.593 m)   Wt 143 lb 11.2 oz (65.2 kg)   LMP  (LMP Unknown)   SpO2 96%   BMI 25.70 kg/m    Subjective:    Patient ID: Valerie Goodman, female    DOB: [birth year], 56 y.o.   MRN: [id number]  HPI: Valerie Goodman is a 56 y.o. female  Chief Complaint  Patient presents with  . Annual Exam   Back pain Plans to start pain management for DDD in November.  No nerve compression on MRI.    Achilles Seeing Orthopedics.    Depression Has had a tough time due to pain Depression screen [facility] 2/9 04/19/2017 07/08/2015  Decreased Interest 3 0  Down, Depressed, Hopeless 3 0  PHQ - 2 Score 6 0  Altered sleeping 3 -  Tired, decreased energy 3 -  Change in appetite 3 -  Feeling bad or failure about yourself  0 -  Trouble concentrating 3 -  Moving slowly or fidgety/restless 3 -  Suicidal thoughts 2 -  PHQ-9 Score 23 -  Difficult doing work/chores Very difficult -   Hypertension Took pill late today and feels its up due to that Using medications without difficulty Average home BPs 150/90   No problems or lightheadedness No chest pain with exertion or shortness of breath No Edema  Relevant past medical, surgical, family and social history reviewed and updated as indicated. Interim medical history since our last visit reviewed. Allergies and medications reviewed and updated.  Review of Systems  Eyes: Positive for pain.  Musculoskeletal:       Feeling hair or spider webs on arms and face.      Per HPI unless specifically indicated above     Objective:    BP (!) 163/108 (BP Location: Left Arm, Cuff Size: Normal)   Pulse 91   Temp 98.9 F (37.2 C)   Ht 5' 2.7" (1.593 m)   Wt 143 lb 11.2 oz (65.2 kg)   LMP  (LMP Unknown)   SpO2 96%   BMI 25.70 kg/m   Wt Readings from Last 3 Encounters:  04/19/17 143 lb 11.2 oz (65.2 kg)  03/30/17 142 lb 12.8 oz (64.8 kg)  03/08/17  145 lb 9.6 oz (66 kg)    Physical Exam  Constitutional: She is oriented to person, place, and time. She appears well-developed and well-nourished.  HENT:  Head: Normocephalic and atraumatic.  Eyes: Pupils are equal, round, and reactive to light. Right eye exhibits no discharge. Left eye exhibits no discharge. No scleral icterus.  Neck: Normal range of motion. Neck supple. Carotid bruit is not present. No thyromegaly present.  Cardiovascular: Normal rate, regular rhythm and normal heart sounds.  Exam reveals no gallop and no friction rub.   No murmur heard. Pulmonary/Chest: Effort normal and breath sounds normal. No respiratory distress. She has no wheezes. She has no rales.  Abdominal: Soft. Bowel sounds are normal. There is no tenderness. There is no rebound.  Genitourinary: Vagina normal and uterus normal. No breast swelling, tenderness or discharge. Cervix exhibits no motion tenderness, no discharge and no friability. Right adnexum displays no mass, no tenderness and no fullness. Left adnexum displays no mass, no tenderness and no fullness.  Musculoskeletal: Normal range of motion.  Lymphadenopathy:    She has no cervical adenopathy.  Neurological: She is alert and  oriented to person, place, and time.  Skin: Skin is warm, dry and intact. No rash noted.  Psychiatric: She has a normal mood and affect. Her speech is normal and behavior is normal. Judgment and thought content normal. Cognition and memory are normal.    Results for orders placed or performed in visit on 11/30/16  Microscopic Examination  Result Value Ref Range   WBC, UA 0-5 0 - 5 /hpf   RBC, UA None seen 0 - 2 /hpf   Epithelial Cells (non renal) 0-10 0 - 10 /hpf   Bacteria, UA Few None seen/Few  UA/M w/rflx Culture, Routine  Result Value Ref Range   Specific Gravity, UA 1.015 1.005 - 1.030   pH, UA 6.0 5.0 - 7.5   Color, UA Yellow Yellow   Appearance Ur Cloudy (A) Clear   Leukocytes, UA Trace (A) Negative   Protein,  UA Negative Negative/Trace   Glucose, UA Negative Negative   Ketones, UA Negative Negative   RBC, UA Trace (A) Negative   Bilirubin, UA Negative Negative   Urobilinogen, Ur 0.2 0.2 - 1.0 mg/dL   Nitrite, UA Negative Negative   Microscopic Examination See below:       Assessment & Plan:   Problem List Items Addressed This Visit      Unprioritized   Allergic conjunctivitis    OTC meds.  Eye drops      Anxiety and depression    Change from Sertraline to Duloxetine for back and depression      Chronic back pain    Awaiting pain clinic.  Refusing further PT not connected to the pain clinic      Relevant Medications   ibuprofen (ADVIL,MOTRIN) 200 MG tablet   Hypertension    Poorly controlled.  Add ARB      Relevant Medications   losartan-hydrochlorothiazide (HYZAAR) 100-25 MG tablet   Other Relevant Orders   Lipid Panel w/o Chol/HDL Ratio   Comprehensive metabolic panel    Other Visit Diagnoses    Annual physical exam    -  Primary   Relevant Orders   IGP, Aptima HPV, rfx 16/18,45   TSH   CBC with Differential/Platelet   UA/M w/rflx Culture, Routine       Follow up plan: Return in about 4 weeks (around 05/17/2017).

## 2017-04-19 NOTE — Assessment & Plan Note (Signed)
OTC meds.  Eye drops

## 2017-04-19 NOTE — Assessment & Plan Note (Signed)
Awaiting pain clinic.  Refusing further PT not connected to the pain clinic

## 2017-04-19 NOTE — Assessment & Plan Note (Signed)
Poorly controlled.  Add ARB

## 2017-04-20 ENCOUNTER — Other Ambulatory Visit: Payer: Self-pay | Admitting: Unknown Physician Specialty

## 2017-04-20 DIAGNOSIS — D751 Secondary polycythemia: Secondary | ICD-10-CM

## 2017-04-20 LAB — COMPREHENSIVE METABOLIC PANEL
ALBUMIN: 4.7 g/dL (ref 3.5–5.5)
ALK PHOS: 113 IU/L (ref 39–117)
ALT: 8 IU/L (ref 0–32)
AST: 12 IU/L (ref 0–40)
Albumin/Globulin Ratio: 1.9 (ref 1.2–2.2)
BILIRUBIN TOTAL: 0.2 mg/dL (ref 0.0–1.2)
BUN / CREAT RATIO: 16 (ref 9–23)
BUN: 11 mg/dL (ref 6–24)
CHLORIDE: 101 mmol/L (ref 96–106)
CO2: 23 mmol/L (ref 20–29)
Calcium: 10.2 mg/dL (ref 8.7–10.2)
Creatinine, Ser: 0.67 mg/dL (ref 0.57–1.00)
GFR calc Af Amer: 114 mL/min/{1.73_m2} (ref >59)
GFR calc non Af Amer: 99 mL/min/{1.73_m2} (ref >59)
GLOBULIN, TOTAL: 2.5 g/dL (ref 1.5–4.5)
Glucose: 81 mg/dL (ref 65–99)
Potassium: 4.1 mmol/L (ref 3.5–5.2)
SODIUM: 141 mmol/L (ref 134–144)
Total Protein: 7.2 g/dL (ref 6.0–8.5)

## 2017-04-20 LAB — CBC WITH DIFFERENTIAL/PLATELET
BASOS ABS: 0 10*3/uL (ref 0.0–0.2)
Basos: 0 %
EOS (ABSOLUTE): 0.4 10*3/uL (ref 0.0–0.4)
Eos: 3 %
HEMOGLOBIN: 17.5 g/dL — AB (ref 11.1–15.9)
Hematocrit: 51.5 % — ABNORMAL HIGH (ref 34.0–46.6)
Immature Grans (Abs): 0 10*3/uL (ref 0.0–0.1)
Immature Granulocytes: 0 %
LYMPHS ABS: 2.8 10*3/uL (ref 0.7–3.1)
Lymphs: 24 %
MCH: 29.6 pg (ref 26.6–33.0)
MCHC: 34 g/dL (ref 31.5–35.7)
MCV: 87 fL (ref 79–97)
Monocytes Absolute: 0.7 10*3/uL (ref 0.1–0.9)
Monocytes: 6 %
NEUTROS ABS: 8 10*3/uL — AB (ref 1.4–7.0)
Neutrophils: 67 %
Platelets: 288 10*3/uL (ref 150–379)
RBC: 5.92 x10E6/uL — ABNORMAL HIGH (ref 3.77–5.28)
RDW: 13.7 % (ref 12.3–15.4)
WBC: 12 10*3/uL — ABNORMAL HIGH (ref 3.4–10.8)

## 2017-04-20 LAB — LIPID PANEL W/O CHOL/HDL RATIO
Cholesterol, Total: 214 mg/dL — ABNORMAL HIGH (ref 100–199)
HDL: 49 mg/dL (ref >39)
LDL Calculated: 121 mg/dL — ABNORMAL HIGH (ref 0–99)
TRIGLYCERIDES: 222 mg/dL — AB (ref 0–149)
VLDL Cholesterol Cal: 44 mg/dL — ABNORMAL HIGH (ref 5–40)

## 2017-04-20 LAB — TSH: TSH: 1.86 u[IU]/mL (ref 0.450–4.500)

## 2017-04-21 LAB — IGP, APTIMA HPV, RFX 16/18,45
HPV APTIMA: NEGATIVE
PAP SMEAR COMMENT: 0

## 2017-05-20 ENCOUNTER — Ambulatory Visit (INDEPENDENT_AMBULATORY_CARE_PROVIDER_SITE_OTHER): Payer: BLUE CROSS/BLUE SHIELD | Admitting: Unknown Physician Specialty

## 2017-05-20 ENCOUNTER — Encounter: Payer: Self-pay | Admitting: Unknown Physician Specialty

## 2017-05-20 VITALS — BP 168/90 | HR 99 | Temp 98.7°F | Wt 145.4 lb

## 2017-05-20 DIAGNOSIS — Z23 Encounter for immunization: Secondary | ICD-10-CM

## 2017-05-20 DIAGNOSIS — I10 Essential (primary) hypertension: Secondary | ICD-10-CM

## 2017-05-20 DIAGNOSIS — F321 Major depressive disorder, single episode, moderate: Secondary | ICD-10-CM

## 2017-05-20 DIAGNOSIS — D72829 Elevated white blood cell count, unspecified: Secondary | ICD-10-CM | POA: Diagnosis not present

## 2017-05-20 DIAGNOSIS — M797 Fibromyalgia: Secondary | ICD-10-CM

## 2017-05-20 HISTORY — DX: Major depressive disorder, single episode, moderate: F32.1

## 2017-05-20 MED ORDER — AMLODIPINE BESYLATE 5 MG PO TABS
5.0000 mg | ORAL_TABLET | Freq: Every day | ORAL | 1 refills | Status: DC
Start: 2017-05-20 — End: 2017-06-21

## 2017-05-20 NOTE — Assessment & Plan Note (Signed)
Mild and chronic.  Recheck next month

## 2017-05-20 NOTE — Assessment & Plan Note (Signed)
Not to goal.  Add Amlodipine to present treatment.

## 2017-05-20 NOTE — Assessment & Plan Note (Addendum)
Mild improvement with Cymbalta.  Encouraged to add counseling to treatment.  Will check next month

## 2017-05-20 NOTE — Progress Notes (Signed)
BP (!) 168/90   Pulse 99   Temp 98.7 F (37.1 C)   Wt 145 lb 6.4 oz (66 kg)   LMP  (LMP Unknown)   SpO2 99%   BMI 26.00 kg/m    Subjective:    Patient ID: Valerie Goodman, female    DOB: 30-Aug-1960, 55 y.o.   MRN: [id number]  HPI: Valerie Goodman is a 56 y.o. female  Chief Complaint  Patient presents with  . Depression    4 week f/up    Depression Pt states the Cymbalta is helping with lower back pain.  Depression isn't better but diagnosed with Fibromyalgia and trying to work that out.   Depression screen [facility] 2/9 05/20/2017 04/19/2017 07/08/2015  Decreased Interest 2 3 0  Down, Depressed, Hopeless 2 3 0  PHQ - 2 Score 4 6 0  Altered sleeping 3 3 -  Tired, decreased energy 3 3 -  Change in appetite 0 3 -  Feeling bad or failure about yourself  2 0 -  Trouble concentrating 0 3 -  Moving slowly or fidgety/restless 2 3 -  Suicidal thoughts 2 2 -  PHQ-9 Score 16 23 -  Difficult doing work/chores - Very difficult -   Hypertension Added Hyzaar last visit Using medications without difficulty Average home BPs not checking    No problems or lightheadedness No chest pain with exertion or shortness of breath No Edema  Elevated WBC Elevated white count here and again at pain management.  Was 13 there.   Relevant past medical, surgical, family and social history reviewed and updated as indicated. Interim medical history since our last visit reviewed. Allergies and medications reviewed and updated.  Review of Systems  Per HPI unless specifically indicated above     Objective:    BP (!) 168/90   Pulse 99   Temp 98.7 F (37.1 C)   Wt 145 lb 6.4 oz (66 kg)   LMP  (LMP Unknown)   SpO2 99%   BMI 26.00 kg/m   Wt Readings from Last 3 Encounters:  05/20/17 145 lb 6.4 oz (66 kg)  04/19/17 143 lb 11.2 oz (65.2 kg)  03/30/17 142 lb 12.8 oz (64.8 kg)    Physical Exam  Constitutional: She is oriented to person, place, and time. She appears well-developed and well-nourished.  No distress.  HENT:  Head: Normocephalic and atraumatic.  Eyes: Conjunctivae and lids are normal. Right eye exhibits no discharge. Left eye exhibits no discharge. No scleral icterus.  Cardiovascular: Normal rate.   Pulmonary/Chest: Effort normal.  Abdominal: Normal appearance. There is no splenomegaly or hepatomegaly.  Musculoskeletal: Normal range of motion.  Neurological: She is alert and oriented to person, place, and time.  Skin: Skin is intact. No rash noted. No pallor.  Psychiatric: She has a normal mood and affect. Her behavior is normal. Judgment and thought content normal.    Results for orders placed or performed in visit on 04/19/17  Microscopic Examination  Result Value Ref Range   WBC, UA 0-5 0 - 5 /hpf   RBC, UA None seen 0 - 2 /hpf   Epithelial Cells (non renal) 0-10 0 - 10 /hpf   Bacteria, UA None seen None seen/Few  Lipid Panel w/o Chol/HDL Ratio  Result Value Ref Range   Cholesterol, Total 214 (H) 100 - 199 mg/dL   Triglycerides 409 (H) 0 - 149 mg/dL   HDL 49 >81 mg/dL   VLDL Cholesterol Cal 44 (H) 5 - 40  mg/dL   LDL Calculated 161 (H) 0 - 99 mg/dL  Comprehensive metabolic panel  Result Value Ref Range   Glucose 81 65 - 99 mg/dL   BUN 11 6 - 24 mg/dL   Creatinine, Ser 0.96 0.57 - 1.00 mg/dL   GFR calc non Af Amer 99 >59 mL/min/1.73   GFR calc Af Amer 114 >59 mL/min/1.73   BUN/Creatinine Ratio 16 9 - 23   Sodium 141 134 - 144 mmol/L   Potassium 4.1 3.5 - 5.2 mmol/L   Chloride 101 96 - 106 mmol/L   CO2 23 20 - 29 mmol/L   Calcium 10.2 8.7 - 10.2 mg/dL   Total Protein 7.2 6.0 - 8.5 g/dL   Albumin 4.7 3.5 - 5.5 g/dL   Globulin, Total 2.5 1.5 - 4.5 g/dL   Albumin/Globulin Ratio 1.9 1.2 - 2.2   Bilirubin Total 0.2 0.0 - 1.2 mg/dL   Alkaline Phosphatase 113 39 - 117 IU/L   AST 12 0 - 40 IU/L   ALT 8 0 - 32 IU/L  TSH  Result Value Ref Range   TSH 1.860 0.450 - 4.500 uIU/mL  CBC with Differential/Platelet  Result Value Ref Range   WBC 12.0 (H) 3.4 - 10.8  x10E3/uL   RBC 5.92 (H) 3.77 - 5.28 x10E6/uL   Hemoglobin 17.5 (H) 11.1 - 15.9 g/dL   Hematocrit 04.5 (H) 40.9 - 46.6 %   MCV 87 79 - 97 fL   MCH 29.6 26.6 - 33.0 pg   MCHC 34.0 31.5 - 35.7 g/dL   RDW 81.1 91.4 - 78.2 %   Platelets 288 150 - 379 x10E3/uL   Neutrophils 67 Not Estab. %   Lymphs 24 Not Estab. %   Monocytes 6 Not Estab. %   Eos 3 Not Estab. %   Basos 0 Not Estab. %   Neutrophils Absolute 8.0 (H) 1.4 - 7.0 x10E3/uL   Lymphocytes Absolute 2.8 0.7 - 3.1 x10E3/uL   Monocytes Absolute 0.7 0.1 - 0.9 x10E3/uL   EOS (ABSOLUTE) 0.4 0.0 - 0.4 x10E3/uL   Basophils Absolute 0.0 0.0 - 0.2 x10E3/uL   Immature Granulocytes 0 Not Estab. %   Immature Grans (Abs) 0.0 0.0 - 0.1 x10E3/uL  UA/M w/rflx Culture, Routine  Result Value Ref Range   Specific Gravity, UA 1.015 1.005 - 1.030   pH, UA 5.5 5.0 - 7.5   Color, UA Yellow Yellow   Appearance Ur Clear Clear   Leukocytes, UA Negative Negative   Protein, UA Negative Negative/Trace   Glucose, UA Negative Negative   Ketones, UA Negative Negative   RBC, UA Trace (A) Negative   Bilirubin, UA Negative Negative   Urobilinogen, Ur 0.2 0.2 - 1.0 mg/dL   Nitrite, UA Negative Negative   Microscopic Examination See below:   IGP, Aptima HPV, rfx 16/18,45  Result Value Ref Range   DIAGNOSIS: Comment    Specimen adequacy: Comment    Clinician Provided ICD10 Comment    Performed by: Comment    PAP Smear Comment .    Note: Comment    Test Methodology Comment    HPV Aptima Negative Negative      Assessment & Plan:   Problem List Items Addressed This Visit      Unprioritized   Depression, major, single episode, moderate (HCC)    Mild improvement with Cymbalta.  Encouraged to add counseling to treatment.  Will check next month      Elevated WBC count    Mild and chronic.  Recheck next month      Fibromyalgia    Recent diagnosis from the pain clinic. Discussed adding counseling sessions.  Pain management strategies are helping        Hypertension    Not to goal.  Add Amlodipine to present treatment.        Relevant Medications   amLODipine (NORVASC) 5 MG tablet    Other Visit Diagnoses    Need for influenza vaccination    -  Primary   Relevant Orders   Flu Vaccine QUAD 36+ mos IM (Completed)        Follow up plan: Return in about 4 weeks (around 06/17/2017).

## 2017-05-20 NOTE — Assessment & Plan Note (Addendum)
Recent diagnosis from the pain clinic. Discussed adding counseling sessions.  Pain management strategies are helping

## 2017-06-21 ENCOUNTER — Ambulatory Visit
Admission: RE | Admit: 2017-06-21 | Discharge: 2017-06-21 | Disposition: A | Discharge status: Home / Self Care | Payer: BLUE CROSS/BLUE SHIELD | Source: Ambulatory Visit | Attending: Unknown Physician Specialty | Admitting: Unknown Physician Specialty

## 2017-06-21 ENCOUNTER — Encounter: Payer: Self-pay | Admitting: Unknown Physician Specialty

## 2017-06-21 ENCOUNTER — Ambulatory Visit (INDEPENDENT_AMBULATORY_CARE_PROVIDER_SITE_OTHER): Payer: BLUE CROSS/BLUE SHIELD | Admitting: Unknown Physician Specialty

## 2017-06-21 VITALS — BP 141/86 | HR 93 | Temp 98.5°F | Wt 145.0 lb

## 2017-06-21 DIAGNOSIS — D72829 Elevated white blood cell count, unspecified: Secondary | ICD-10-CM | POA: Insufficient documentation

## 2017-06-21 DIAGNOSIS — M545 Low back pain: Secondary | ICD-10-CM | POA: Diagnosis not present

## 2017-06-21 DIAGNOSIS — G8929 Other chronic pain: Secondary | ICD-10-CM | POA: Diagnosis not present

## 2017-06-21 DIAGNOSIS — F321 Major depressive disorder, single episode, moderate: Secondary | ICD-10-CM | POA: Diagnosis not present

## 2017-06-21 DIAGNOSIS — M797 Fibromyalgia: Secondary | ICD-10-CM

## 2017-06-21 LAB — UA/M W/RFLX CULTURE, ROUTINE
Bilirubin, UA: NEGATIVE
GLUCOSE, UA: NEGATIVE
KETONES UA: NEGATIVE
Leukocytes, UA: NEGATIVE
Nitrite, UA: NEGATIVE
PROTEIN UA: NEGATIVE
SPEC GRAV UA: 1.02 (ref 1.005–1.030)
Urobilinogen, Ur: 0.2 mg/dL (ref 0.2–1.0)
pH, UA: 7 (ref 5.0–7.5)

## 2017-06-21 LAB — MICROSCOPIC EXAMINATION: WBC UA: NONE SEEN /HPF (ref 0–?)

## 2017-06-21 MED ORDER — AMLODIPINE BESYLATE 5 MG PO TABS: 5.0000 mg | ORAL_TABLET | Freq: Every day | ORAL | 1 refills | Status: DC

## 2017-06-21 MED ORDER — DULOXETINE HCL 60 MG PO CPEP: 60.0000 mg | ORAL_CAPSULE | Freq: Every day | ORAL | 3 refills | Status: DC

## 2017-06-21 NOTE — Assessment & Plan Note (Signed)
Continue Cymbalta as it is helping back pain.  Check other rheumatological labs.

## 2017-06-21 NOTE — Assessment & Plan Note (Signed)
Improvement with Cymbalta

## 2017-06-21 NOTE — Assessment & Plan Note (Signed)
WBC was 13.7K today.  Elevated from 12K.  Check urine and chest x-ray.  Recheck in 4 weeks.  Consider hematology referral.

## 2017-06-21 NOTE — Progress Notes (Signed)
BP (!) 141/86   Pulse 93   Temp 98.5 F (36.9 C) (Oral)   Wt 145 lb (65.8 kg)   LMP  (LMP Unknown)   SpO2 95%   BMI 25.93 kg/m    Subjective:    Patient ID: Valerie Goodman, female    DOB: [birth year], 56 y.o.   MRN: [id number]  HPI: TERREE GAULTNEY is a 56 y.o. female  Chief Complaint  Patient presents with  . Depression    4 week f/up  . Hypertension    4 week f/up  . Labs Only    4 week f/up for elevated WBC   Hypertension Added Amlodipine last visit.   Using medications without difficulty Average home BPs much better than previous   No problems or lightheadedness No chest pain with exertion or shortness of breath No Edema  Elevated WBC Persistent elevation.  F/u today  Depression Pt continues to struggle with depression.  Taking Cymbalta 60 mg.  Feels like struggling with the Fibromyalgia is overwhelming.  States she is apathetic and has days with no energy.  States she went back to work which isn't going well and taking naps in the middle of the day.  States she doesn't want to hurt herself but sometimes feels like she doesn't want to live with low energy.  Did not go to counseling Depression screen [facility] 2/9 06/21/2017 05/20/2017 04/19/2017 07/08/2015  Decreased Interest 2 2 3  0  Down, Depressed, Hopeless 3 2 3  0  PHQ - 2 Score 5 4 6  0  Altered sleeping 3 3 3  -  Tired, decreased energy 3 3 3  -  Change in appetite 0 0 3 -  Feeling bad or failure about yourself  3 2 0 -  Trouble concentrating 2 0 3 -  Moving slowly or fidgety/restless 2 2 3  -  Suicidal thoughts 2 2 2  -  PHQ-9 Score 20 16 23  -  Difficult doing work/chores - - Very difficult -   Elevated WBC Pt with persistent elevation in WBC.  Last was 12K.    Relevant past medical, surgical, family and social history reviewed and updated as indicated. Interim medical history since our last visit reviewed. Allergies and medications reviewed and updated.  Review of Systems  Per HPI unless specifically  indicated above     Objective:    BP (!) 141/86   Pulse 93   Temp 98.5 F (36.9 C) (Oral)   Wt 145 lb (65.8 kg)   LMP  (LMP Unknown)   SpO2 95%   BMI 25.93 kg/m   Wt Readings from Last 3 Encounters:  06/21/17 145 lb (65.8 kg)  05/20/17 145 lb 6.4 oz (66 kg)  04/19/17 143 lb 11.2 oz (65.2 kg)    Physical Exam  Constitutional: She is oriented to person, place, and time. She appears well-developed and well-nourished. No distress.  HENT:  Head: Normocephalic and atraumatic.  Eyes: Conjunctivae and lids are normal. Right eye exhibits no discharge. Left eye exhibits no discharge. No scleral icterus.  Neck: Normal range of motion. Neck supple. No JVD present. Carotid bruit is not present.  Cardiovascular: Normal rate, regular rhythm and normal heart sounds.  Pulmonary/Chest: Effort normal and breath sounds normal.  Abdominal: Normal appearance. There is no splenomegaly or hepatomegaly.  Musculoskeletal: Normal range of motion.  Neurological: She is alert and oriented to person, place, and time.  Skin: Skin is warm, dry and intact. No rash noted. No pallor.  Psychiatric: She has a  normal mood and affect. Her behavior is normal. Judgment and thought content normal.    Results for orders placed or performed in visit on 04/19/17  Microscopic Examination  Result Value Ref Range   WBC, UA 0-5 0 - 5 /hpf   RBC, UA None seen 0 - 2 /hpf   Epithelial Cells (non renal) 0-10 0 - 10 /hpf   Bacteria, UA None seen None seen/Few  Lipid Panel w/o Chol/HDL Ratio  Result Value Ref Range   Cholesterol, Total 214 (H) 100 - 199 mg/dL   Triglycerides 222 (H) 0 - 149 mg/dL   HDL 49 >39 mg/dL   VLDL Cholesterol Cal 44 (H) 5 - 40 mg/dL   LDL Calculated 121 (H) 0 - 99 mg/dL  Comprehensive metabolic panel  Result Value Ref Range   Glucose 81 65 - 99 mg/dL   BUN 11 6 - 24 mg/dL   Creatinine, Ser 0.67 0.57 - 1.00 mg/dL   GFR calc non Af Amer 99 >59 mL/min/1.73   GFR calc Af Amer 114 >59 mL/min/1.73     BUN/Creatinine Ratio 16 9 - 23   Sodium 141 134 - 144 mmol/L   Potassium 4.1 3.5 - 5.2 mmol/L   Chloride 101 96 - 106 mmol/L   CO2 23 20 - 29 mmol/L   Calcium 10.2 8.7 - 10.2 mg/dL   Total Protein 7.2 6.0 - 8.5 g/dL   Albumin 4.7 3.5 - 5.5 g/dL   Globulin, Total 2.5 1.5 - 4.5 g/dL   Albumin/Globulin Ratio 1.9 1.2 - 2.2   Bilirubin Total 0.2 0.0 - 1.2 mg/dL   Alkaline Phosphatase 113 39 - 117 IU/L   AST 12 0 - 40 IU/L   ALT 8 0 - 32 IU/L  TSH  Result Value Ref Range   TSH 1.860 0.450 - 4.500 uIU/mL  CBC with Differential/Platelet  Result Value Ref Range   WBC 12.0 (H) 3.4 - 10.8 x10E3/uL   RBC 5.92 (H) 3.77 - 5.28 x10E6/uL   Hemoglobin 17.5 (H) 11.1 - 15.9 g/dL   Hematocrit 51.5 (H) 34.0 - 46.6 %   MCV 87 79 - 97 fL   MCH 29.6 26.6 - 33.0 pg   MCHC 34.0 31.5 - 35.7 g/dL   RDW 13.7 12.3 - 15.4 %   Platelets 288 150 - 379 x10E3/uL   Neutrophils 67 Not Estab. %   Lymphs 24 Not Estab. %   Monocytes 6 Not Estab. %   Eos 3 Not Estab. %   Basos 0 Not Estab. %   Neutrophils Absolute 8.0 (H) 1.4 - 7.0 x10E3/uL   Lymphocytes Absolute 2.8 0.7 - 3.1 x10E3/uL   Monocytes Absolute 0.7 0.1 - 0.9 x10E3/uL   EOS (ABSOLUTE) 0.4 0.0 - 0.4 x10E3/uL   Basophils Absolute 0.0 0.0 - 0.2 x10E3/uL   Immature Granulocytes 0 Not Estab. %   Immature Grans (Abs) 0.0 0.0 - 0.1 x10E3/uL  UA/M w/rflx Culture, Routine  Result Value Ref Range   Specific Gravity, UA 1.015 1.005 - 1.030   pH, UA 5.5 5.0 - 7.5   Color, UA Yellow Yellow   Appearance Ur Clear Clear   Leukocytes, UA Negative Negative   Protein, UA Negative Negative/Trace   Glucose, UA Negative Negative   Ketones, UA Negative Negative   RBC, UA Trace (A) Negative   Bilirubin, UA Negative Negative   Urobilinogen, Ur 0.2 0.2 - 1.0 mg/dL   Nitrite, UA Negative Negative   Microscopic Examination See below:   IGP, Aptima  HPV, rfx 16/18,45  Result Value Ref Range   DIAGNOSIS: Comment    Specimen adequacy: Comment    Clinician Provided  ICD10 Comment    Performed by: Comment    PAP Smear Comment .    Note: Comment    Test Methodology Comment    HPV Aptima Negative Negative      Assessment & Plan:   Problem List Items Addressed This Visit      Unprioritized   Depression, major, single episode, moderate ([place])    No improvement.  Encouraged counseling.  Recheck in 4 weeks and consider adding Buproprion      Relevant Medications   DULoxetine (CYMBALTA) 60 MG capsule   Elevated WBC count - Primary    WBC was 13.7K today.  Elevated from 12K.  Check urine and chest x-ray.  Recheck in 4 weeks.  Consider hematology referral.        Relevant Orders   CBC With Differential/Platelet   UA/M w/rflx Culture, Routine   DG Chest 2 View   Fibromyalgia    Continue Cymbalta as it is helping back pain.  Check other rheumatological labs.        Relevant Orders   VITAMIN D 25 Hydroxy (Vit-D Deficiency, Fractures)   Vitamin B12   ANA w/Reflex   Sed Rate (ESR)   C-reactive protein   Low back pain    Improvement with Cymbalta          Follow up plan: Return in about 4 weeks (around 07/19/2017).

## 2017-06-21 NOTE — Assessment & Plan Note (Signed)
No improvement.  Encouraged counseling.  Recheck in 4 weeks and consider adding Buproprion

## 2017-06-22 LAB — VITAMIN B12: Vitamin B-12: 402 pg/mL (ref 232–1245)

## 2017-06-22 LAB — VITAMIN D 25 HYDROXY (VIT D DEFICIENCY, FRACTURES): VIT D 25 HYDROXY: 24.2 ng/mL — AB (ref 30.0–100.0)

## 2017-06-22 LAB — SEDIMENTATION RATE: Sed Rate: 23 mm/hr (ref 0–40)

## 2017-06-22 LAB — C-REACTIVE PROTEIN: CRP: 2.5 mg/L (ref 0.0–4.9)

## 2017-06-22 LAB — ANA W/REFLEX: ANA: NEGATIVE

## 2017-06-22 NOTE — Progress Notes (Signed)
Normal labs.  Pt notified through mychart

## 2017-07-18 LAB — CBC WITH DIFFERENTIAL/PLATELET
Hematocrit: 49.7 % — ABNORMAL HIGH (ref 34.0–46.6)
Hemoglobin: 16.9 g/dL — ABNORMAL HIGH (ref 11.1–15.9)
LYMPHS ABS: 4 10*3/uL — AB (ref 0.7–3.1)
Lymphs: 29 %
MCH: 29.5 pg (ref 26.6–33.0)
MCHC: 34 g/dL (ref 31.5–35.7)
MCV: 87 fL (ref 79–97)
MID (ABSOLUTE): 1.2 10*3/uL (ref 0.1–1.6)
MID: 9 %
NEUTROS ABS: 8.5 10*3/uL — AB (ref 1.4–7.0)
NEUTROS PCT: 62 %
PLATELETS: 349 10*3/uL (ref 150–379)
RBC: 5.73 x10E6/uL — ABNORMAL HIGH (ref 3.77–5.28)
RDW: 14.8 % (ref 12.3–15.4)
WBC: 13.7 10*3/uL — ABNORMAL HIGH (ref 3.4–10.8)

## 2017-07-27 ENCOUNTER — Ambulatory Visit: Payer: BLUE CROSS/BLUE SHIELD | Admitting: Unknown Physician Specialty

## 2017-08-01 ENCOUNTER — Telehealth: Payer: Self-pay | Admitting: Unknown Physician Specialty

## 2017-08-01 ENCOUNTER — Ambulatory Visit: Payer: Self-pay | Admitting: Unknown Physician Specialty

## 2017-08-01 DIAGNOSIS — Z598 Other problems related to housing and economic circumstances: Secondary | ICD-10-CM

## 2017-08-01 DIAGNOSIS — Z599 Problem related to housing and economic circumstances, unspecified: Secondary | ICD-10-CM

## 2017-08-01 NOTE — Telephone Encounter (Signed)
No show today.  Please reschedule.  Not urgent

## 2017-08-04 NOTE — Telephone Encounter (Signed)
No Show Follow up Call  - patient missed appointment because she doesn't have insurance anymore. States she tired calling to cancel and when someone picked up it didn't sound like the normal call so she hung up. Informed patient of new call center and cancellation policy for future calls.  - will send [facility] referral to see if patient is medicaid eligible and financial assistance  - no reschedule today - NO Pending diagnostic testing for this appointment. -  Transportation Concerns? NO

## 2018-01-20 IMAGING — DX DG LUMBAR SPINE COMPLETE 4+V
5 series · 5 of 5 positions shown · non-contrast
Comparison: None

CLINICAL DATA: Thoracic pain 6 weeks ago now into lumbar spine and
LEFT hip, pain down LEFT leg and into LEFT groin, lifts a lot at
work

EXAM:
LUMBAR SPINE - COMPLETE 4+ VIEW

[l-spine ap]
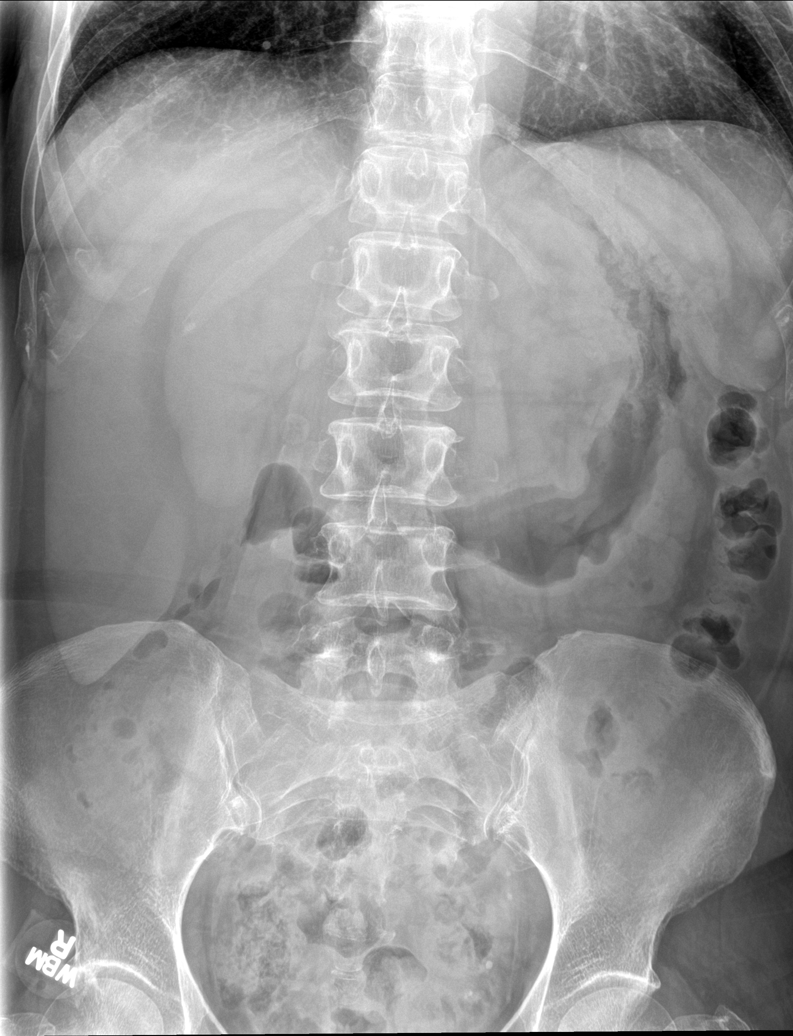

[l-spine obl (1 of 2)]
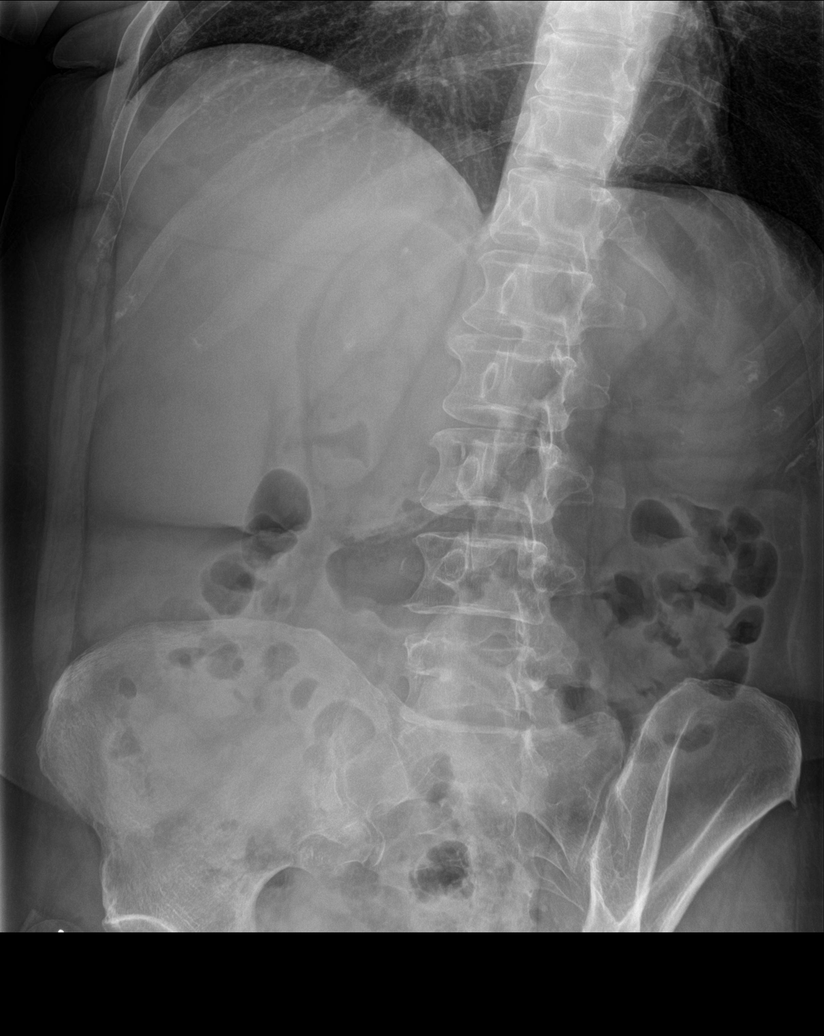

[l-spine obl (2 of 2)]
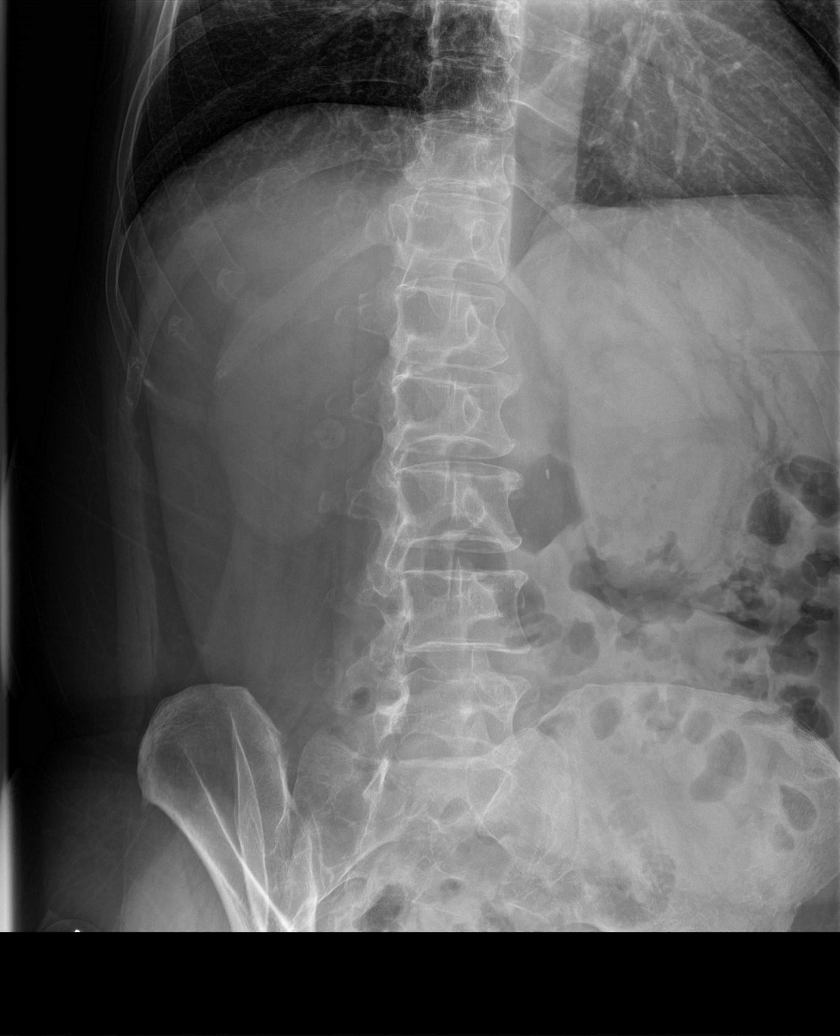

[l-spine lat]
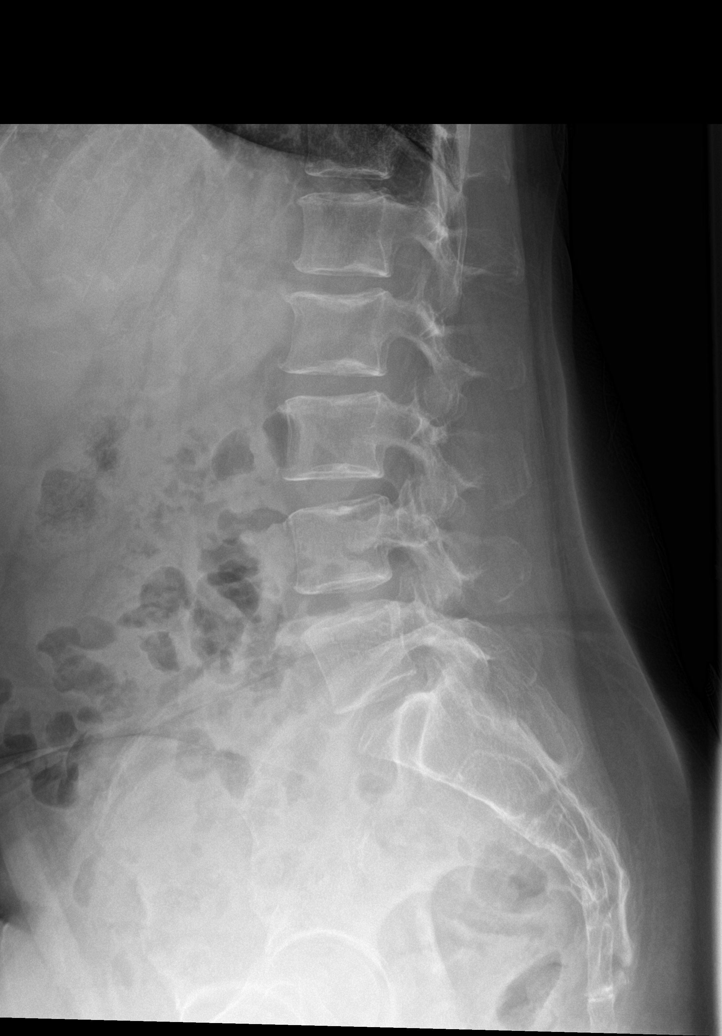

[l-spine spot]
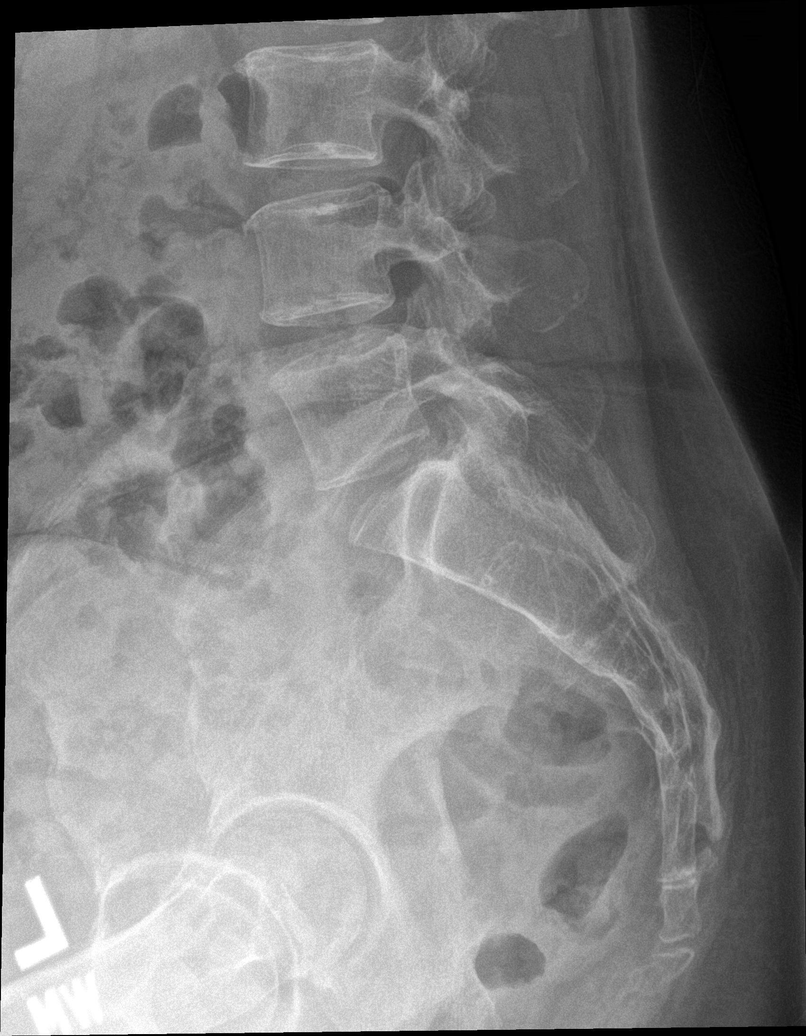

[5 of 5 positions shown; findings below may reference images not displayed]

FINDINGS: Osseous demineralization.

Five non-rib-bearing lumbar vertebra.

Vertebral body and disc space heights maintained.

No acute fracture, subluxation or bone destruction.

No spondylolysis.

SI joints symmetric.
IMPRESSION: No acute osseous abnormalities.

## 2019-01-09 ENCOUNTER — Encounter: Payer: Self-pay | Admitting: Nurse Practitioner

## 2021-06-25 ENCOUNTER — Other Ambulatory Visit: Payer: Self-pay

## 2021-06-25 ENCOUNTER — Encounter: Payer: Self-pay | Admitting: Emergency Medicine

## 2021-06-25 ENCOUNTER — Emergency Department
Admission: EM | Admit: 2021-06-25 | Discharge: 2021-06-26 | Disposition: A | Discharge status: Other Acute Inpatient Hospital | Payer: BLUE CROSS/BLUE SHIELD | Attending: Emergency Medicine | Admitting: Emergency Medicine

## 2021-06-25 DIAGNOSIS — Z20822 Contact with and (suspected) exposure to covid-19: Secondary | ICD-10-CM | POA: Insufficient documentation

## 2021-06-25 DIAGNOSIS — F319 Bipolar disorder, unspecified: Secondary | ICD-10-CM | POA: Diagnosis present

## 2021-06-25 DIAGNOSIS — R45851 Suicidal ideations: Secondary | ICD-10-CM

## 2021-06-25 DIAGNOSIS — F17210 Nicotine dependence, cigarettes, uncomplicated: Secondary | ICD-10-CM | POA: Insufficient documentation

## 2021-06-25 DIAGNOSIS — F332 Major depressive disorder, recurrent severe without psychotic features: Secondary | ICD-10-CM | POA: Diagnosis present

## 2021-06-25 DIAGNOSIS — I10 Essential (primary) hypertension: Secondary | ICD-10-CM | POA: Insufficient documentation

## 2021-06-25 HISTORY — DX: Bipolar disorder, unspecified: F31.9

## 2021-06-25 HISTORY — DX: Essential (primary) hypertension: I10

## 2021-06-25 LAB — URINE DRUG SCREEN, QUALITATIVE (ARMC ONLY)
Amphetamines, Ur Screen: NOT DETECTED
Barbiturates, Ur Screen: NOT DETECTED
Benzodiazepine, Ur Scrn: POSITIVE — AB
Cannabinoid 50 Ng, Ur ~~LOC~~: POSITIVE — AB
Cocaine Metabolite,Ur ~~LOC~~: NOT DETECTED
MDMA (Ecstasy)Ur Screen: NOT DETECTED
Methadone Scn, Ur: NOT DETECTED
Opiate, Ur Screen: NOT DETECTED
Phencyclidine (PCP) Ur S: NOT DETECTED
Tricyclic, Ur Screen: NOT DETECTED

## 2021-06-25 LAB — COMPREHENSIVE METABOLIC PANEL
ALT: 16 U/L (ref 0–44)
AST: 18 U/L (ref 15–41)
Albumin: 4.7 g/dL (ref 3.5–5.0)
Alkaline Phosphatase: 91 U/L (ref 38–126)
Anion gap: 8 (ref 5–15)
BUN: 10 mg/dL (ref 8–23)
CO2: 27 mmol/L (ref 22–32)
Calcium: 9.4 mg/dL (ref 8.9–10.3)
Chloride: 104 mmol/L (ref 98–111)
Creatinine, Ser: 0.61 mg/dL (ref 0.44–1.00)
GFR, Estimated: >60 mL/min (ref >60)
Glucose, Bld: 134 mg/dL — ABNORMAL HIGH (ref 70–99)
Potassium: 4.1 mmol/L (ref 3.5–5.1)
Sodium: 139 mmol/L (ref 135–145)
Total Bilirubin: 0.5 mg/dL (ref 0.3–1.2)
Total Protein: 8.2 g/dL — ABNORMAL HIGH (ref 6.5–8.1)

## 2021-06-25 LAB — CBC
HCT: 49.1 % — ABNORMAL HIGH (ref 36.0–46.0)
Hemoglobin: 15.9 g/dL — ABNORMAL HIGH (ref 12.0–15.0)
MCH: 28.9 pg (ref 26.0–34.0)
MCHC: 32.4 g/dL (ref 30.0–36.0)
MCV: 89.3 fL (ref 80.0–100.0)
Platelets: 326 10*3/uL (ref 150–400)
RBC: 5.5 MIL/uL — ABNORMAL HIGH (ref 3.87–5.11)
RDW: 13.8 % (ref 11.5–15.5)
WBC: 11.8 10*3/uL — ABNORMAL HIGH (ref 4.0–10.5)
nRBC: 0 % (ref 0.0–0.2)

## 2021-06-25 LAB — ACETAMINOPHEN LEVEL: Acetaminophen (Tylenol), Serum: <10 ug/mL — ABNORMAL LOW (ref 10–30)

## 2021-06-25 LAB — SALICYLATE LEVEL: Salicylate Lvl: <7 mg/dL — ABNORMAL LOW (ref 7.0–30.0)

## 2021-06-25 LAB — ETHANOL: Alcohol, Ethyl (B): 152 mg/dL — ABNORMAL HIGH (ref <10)

## 2021-06-25 NOTE — ED Triage Notes (Signed)
Pt to ED voluntary with BPD for SI.  States got into argument with her mother.  States took five 10mg  valium around 1830, two shots of tequila and 2 bud lights earlier in the day.  States had intention of going to storage building to get more alcohol.  States took pills intentionally to hurt self.  States intermittent auditory and visual hallucinations.  Hx of bipolar.  Pt alert, cooperative.  Pt lit cigarette in middle of triage area while waiting for triage.  Pt dressed into hospital appropriate scrubs by this RN and [id number], EDT.  Belongings placed into 1 belongings bag and contents include: 1 pair black shoes, 1 pair brown socks, 1 brown scarf, 1 long brown pants, 1 long sleeve black shirt, 1 black jacket, 1 zipper stripped jacket, cigarettes, cell phone, keys, 1 black underwear, 1 lighter, 1 black bra.

## 2021-06-25 NOTE — ED Provider Notes (Signed)
[facility]  ____________________________________________   Event Date/Time   First MD Initiated Contact with Patient 06/25/21 2102     (approximate)  I have reviewed the triage vital signs and the nursing notes.   HISTORY  Chief Complaint Suicidal    HPI Valerie Goodman is a 60 y.o. female with past medical history of bipolar disorder and hypertension who presents with suicidal ideation.  Patient endorses feeling like she is having a nervous breakdown.  She took 510 mg of Valium and 2 shots of tequila.  She was doing this with the intent to harm herself.  She endorses ongoing suicidal ideation.  She denies homicidal ideation.  She has chronic pain from fibromyalgia but no new pain.  Denies other new medical complaints at this time.         Past Medical History:  Diagnosis Date   Bipolar 1 disorder (HCC)    Hypertension     There are no problems to display for this patient.   Past Surgical History:  Procedure Laterality Date   TUBAL LIGATION     WISDOM TOOTH EXTRACTION      Prior to Admission medications   Not on File    Allergies Mobic [meloxicam]  History reviewed. No pertinent family history.  Social History Social History   Tobacco Use   Smoking status: Every Day    Types: Cigarettes   Smokeless tobacco: Never  Substance Use Topics   Alcohol use: Yes    Comment: drinks 2 days a week typically; 2 shots liqour and 2 budlight 06/25/21   Drug use: Yes    Types: Marijuana    Review of Systems   Review of Systems  Constitutional:  Negative for chills and fever.  Respiratory:  Negative for shortness of breath.   Cardiovascular:  Negative for chest pain.  Gastrointestinal:  Negative for abdominal pain.  Psychiatric/Behavioral:  Positive for suicidal ideas.   All other systems reviewed and are negative.  Physical Exam Updated Vital Signs BP (!) 150/112 (BP Location: Left Arm)   Pulse (!) 109   Temp 98.3 F (36.8 C) (Oral)    Resp 17   Ht 5\' 3"  (1.6 m)   Wt 63 kg   SpO2 97%   BMI 24.62 kg/m   Physical Exam Vitals and nursing note reviewed.  Constitutional:      General: She is not in acute distress.    Appearance: Normal appearance.  HENT:     Head: Normocephalic and atraumatic.  Eyes:     General: No scleral icterus.    Conjunctiva/sclera: Conjunctivae normal.  Pulmonary:     Effort: Pulmonary effort is normal. No respiratory distress.     Breath sounds: No stridor.  Musculoskeletal:        General: No deformity or signs of injury.     Cervical back: Normal range of motion.  Skin:    General: Skin is dry.     Coloration: Skin is not jaundiced or pale.  Neurological:     General: No focal deficit present.     Mental Status: She is alert and oriented to person, place, and time. Mental status is at baseline.     Comments: Somewhat dysarthric  Psychiatric:     Comments: Patient is tearful, positive suicidal ideation     LABS (all labs ordered are listed, but only abnormal results are displayed)  Labs Reviewed  CBC - Abnormal; Notable for the following components:      Result Value  WBC 11.8 (*)    RBC 5.50 (*)    Hemoglobin 15.9 (*)    HCT 49.1 (*)    All other components within normal limits  RESP PANEL BY RT-PCR (FLU A&B, COVID) ARPGX2  COMPREHENSIVE METABOLIC PANEL  ETHANOL  SALICYLATE LEVEL  ACETAMINOPHEN LEVEL  URINE DRUG SCREEN, QUALITATIVE ([facility] ONLY)   ____________________________________________  EKG  N/a ____________________________________________  RADIOLOGY Ky Barban, personally viewed and evaluated these images (plain radiographs) as part of my medical decision making, as well as reviewing the written report by the radiologist.  ED MD interpretation:  n/a    ____________________________________________   PROCEDURES  Procedure(s) performed (including Critical Care):  Procedures   ____________________________________________   INITIAL  IMPRESSION / ASSESSMENT AND PLAN / ED COURSE     Patient is a 60 year old female with history of bipolar disorder who presents with suicidal ideation.  She took several Valium pills and drink alcohol with the passive intent to harm herself.  She is somewhat tachycardic and hypertensive but overall appears well.  She is tearful and seems to have insight into her condition.  She presents here voluntarily.  She does endorse ongoing suicidal ideation.  We will consult psychiatry and TTS.  Anticipate inpatient psych admission.  . The patient has been placed in psychiatric observation due to the need to provide a safe environment for the patient while obtaining psychiatric consultation and evaluation, as well as ongoing medical and medication management to treat the patient's condition.  The patient has not been placed under full IVC at this time.      ____________________________________________   FINAL CLINICAL IMPRESSION(S) / ED DIAGNOSES  Final diagnoses:  Suicidal ideation     ED Discharge Orders     None        Note:  This document was prepared using Dragon voice recognition software and may include unintentional dictation errors.    Georga Hacking, MD 06/25/21 2127

## 2021-06-26 DIAGNOSIS — F319 Bipolar disorder, unspecified: Secondary | ICD-10-CM | POA: Diagnosis present

## 2021-06-26 DIAGNOSIS — F332 Major depressive disorder, recurrent severe without psychotic features: Secondary | ICD-10-CM | POA: Diagnosis present

## 2021-06-26 LAB — RESP PANEL BY RT-PCR (FLU A&B, COVID) ARPGX2
Influenza A by PCR: NEGATIVE
Influenza B by PCR: NEGATIVE
SARS Coronavirus 2 by RT PCR: NEGATIVE

## 2021-06-26 NOTE — ED Notes (Signed)
Ambulated to [employer] [facility]. 2 bags of belongings and paperwork sent with.

## 2021-06-26 NOTE — ED Notes (Signed)
EMTALA reviewed by this RN.  

## 2021-06-26 NOTE — ED Notes (Signed)
Called safe transport for transport to [facility]  vol  [phone]

## 2021-06-26 NOTE — BH Assessment (Signed)
Referral information for Psychiatric Hospitalization faxed to:  [facility] ([phone]- [phone]),   [facility] [phone] -[phone]) [phone]  [facility]arl[facility] Plater [phone]),  [facility] [phone]- [phone]),   [facility] [phone] or [phone]),   [facility] [phone]).  Ermalene Searing

## 2021-06-26 NOTE — ED Provider Notes (Signed)
Emergency Medicine Observation Re-evaluation Note  Valerie Goodman is a 60 y.o. female, seen on rounds today.  Pt initially presented to the ED for complaints of Suicidal Currently, the patient is resting.  Physical Exam  BP (!) 150/112 (BP Location: Left Arm)   Pulse (!) 109   Temp 98.3 F (36.8 C) (Oral)   Resp 17   Ht 1.6 m (5\' 3" )   Wt 63 kg   SpO2 97%   BMI 24.62 kg/m  Physical Exam Gen:  No acute distress Resp:  Breathing easily and comfortably, no accessory muscle usage Neuro:  Moving all four extremities, no gross focal neuro deficits Psych:  Resting currently, calm when awake ED Course / MDM  EKG:   I have reviewed the labs performed to date as well as medications administered while in observation.  Recent changes in the last 24 hours include initial EDP and psychiatry evaluations.  Plan  Current plan is for inpatient psychiatric treatment.  Valerie Goodman is not under involuntary commitment.     [name], MD 06/26/21 [phone]

## 2021-06-26 NOTE — BH Assessment (Signed)
Writer explained to patient she is going to [facility], signed voluntary form. Updated negative COVID and voluntary signed faxed to [facility].

## 2021-06-26 NOTE — BH Assessment (Addendum)
Patient has been accepted to [facility].  Patient assigned to room 147 Bed 2 in the [facility] Accepting physician is Dr. Cherylin Mylar.  Call report to [phone].  Representative was [employer].   ER Staff is aware of it:  Bonita Quin, ER Secretary  Dr. York Cerise, ER MD  Bella Kennedy, Patient's Nurse     Patient can arrive anytime today.   PLEASE BE ADVISED: Representative Lieutenant Diego requested pt sign voluntary consents and have signed the paperwork faxed back [phone].  Additionally, Covid results were requested.
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
## 2021-06-26 NOTE — Consult Note (Signed)
[facility] Face-to-Face Psychiatry Consult   Reason for Consult: Suicidal Referring Physician: Dr. Sidney Ace Patient Identification: Valerie Goodman MRN:  [id number] Principal Diagnosis: <principal problem not specified> Diagnosis:  Active Problems:   MDD (major depressive disorder), recurrent episode, severe (HCC)   Bipolar 1 disorder (HCC)   Total Time spent with patient: 1 hour  Subjective: "My boyfriend left me." Valerie Goodman is a 60 y.o. female patient presented to [facility] ED via law enforcement due to complaints of suicidal ideation with a plan to overdose. The patient shared that she got into an argument with her mom. The patient shared she took five 10mg  of valium around 1830, two shots of tequila, and two bud lights earlier in the day.  The patient stated she intended to go to the storage building to get more alcohol.  States took pills intentionally to hurt themselves.  The patient voiced that in early October, her boyfriend of eight years ended their relationship without any reason, leaving her devastated. The patient stated she was using one of his vehicles to get to and from work. She explained that because he had left her, she had no transportation and could not get to work. The patient shared she had to move in with her mother. The patient voiced, " my mom is mean as a snake. I have to live with her."I was working a job I loved a lot, and I had to give it up." The patient was seen face-to-face by this provider; the chart was reviewed and consulted with Dr.McHugh on 06/25/2021 due to the patient's care. It was discussed with the EDP that the patient does meet the criteria to be admitted to the inpatient unit.  On evaluation, the patient is alert and oriented x4, calm, and emotional but stated, "I can end my life. I have attempted in the past." The patient is cooperative and mood-congruent with affect.  The patient does not appear to be responding to internal or external stimuli. Neither is the  patient presenting with any delusional thinking. The patient denies auditory or visual hallucinations. The patient admits to suicidal ideation but denies homicidal or self-harm ideations. The patient is not presenting with any psychotic or paranoid behaviors.   HPI: Per Dr. 14/07/2020, Valerie Goodman is a 60 y.o. female with past medical history of bipolar disorder and hypertension who presents with suicidal ideation.  Patient endorses feeling like she is having a nervous breakdown.  She took 510 mg of Valium and 2 shots of tequila.  She was doing this with the intent to harm herself.  She endorses ongoing suicidal ideation.  She denies homicidal ideation.  She has chronic pain from fibromyalgia but no new pain.  Denies other new medical complaints at this time.  Past Psychiatric History:  Bipolar 1 disorder (HCC)  Risk to Self:   Risk to Others:   Prior Inpatient Therapy:   Prior Outpatient Therapy:    Past Medical History:  Past Medical History:  Diagnosis Date   Bipolar 1 disorder (HCC)    Hypertension     Past Surgical History:  Procedure Laterality Date   TUBAL LIGATION     WISDOM TOOTH EXTRACTION     Family History: History reviewed. No pertinent family history. Family Psychiatric  History:  Social History:  Social History   Substance and Sexual Activity  Alcohol Use Yes   Comment: drinks 2 days a week typically; 2 shots liqour and 2 budlight 06/25/21     Social History   Substance and  Sexual Activity  Drug Use Yes   Types: Marijuana    Social History   Socioeconomic History   Marital status: Single    Spouse name: Not on file   Number of children: Not on file   Years of education: Not on file   Highest education level: Not on file  Occupational History   Not on file  Tobacco Use   Smoking status: Every Day    Types: Cigarettes   Smokeless tobacco: Never  Substance and Sexual Activity   Alcohol use: Yes    Comment: drinks 2 days a week typically; 2 shots liqour  and 2 budlight 06/25/21   Drug use: Yes    Types: Marijuana   Sexual activity: Not on file  Other Topics Concern   Not on file  Social History Narrative   Not on file   Social Determinants of Health   Financial Resource Strain: Not on file  Food Insecurity: Not on file  Transportation Needs: Not on file  Physical Activity: Not on file  Stress: Not on file  Social Connections: Not on file   Additional Social History:    Allergies:   Allergies  Allergen Reactions   Mobic [Meloxicam] Anaphylaxis    Labs:  Results for orders placed or performed during the hospital encounter of 06/25/21 (from the past 48 hour(s))  Comprehensive metabolic panel     Status: Abnormal   Collection Time: 06/25/21  8:53 PM  Result Value Ref Range   Sodium 139 135 - 145 mmol/L   Potassium 4.1 3.5 - 5.1 mmol/L   Chloride 104 98 - 111 mmol/L   CO2 27 22 - 32 mmol/L   Glucose, Bld 134 (H) 70 - 99 mg/dL    Comment: Glucose reference range applies only to samples taken after fasting for at least 8 hours.   BUN 10 8 - 23 mg/dL   Creatinine, Ser 3.26 0.44 - 1.00 mg/dL   Calcium 9.4 8.9 - 71.2 mg/dL   Total Protein 8.2 (H) 6.5 - 8.1 g/dL   Albumin 4.7 3.5 - 5.0 g/dL   AST 18 15 - 41 U/L   ALT 16 0 - 44 U/L   Alkaline Phosphatase 91 38 - 126 U/L   Total Bilirubin 0.5 0.3 - 1.2 mg/dL   GFR, Estimated >45 >80 mL/min    Comment: (NOTE) Calculated using the CKD-EPI Creatinine Equation (2021)    Anion gap 8 5 - 15    Comment: Performed at [facility], [address] [place], [place] [address]  Ethanol     Status: Abnormal   Collection Time: 06/25/21  8:53 PM  Result Value Ref Range   Alcohol, Ethyl (B) 152 (H) <10 mg/dL    Comment: (NOTE) Lowest detectable limit for serum alcohol is 10 mg/dL.  For medical purposes only. Performed at [facility], [address] [place], [place] [address]   Salicylate level     Status: Abnormal   Collection Time: 06/25/21  8:53 PM  Result  Value Ref Range   Salicylate Lvl <7.0 (L) 7.0 - 30.0 mg/dL    Comment: Performed at [facility], [address] [place], [place] [address]  Acetaminophen level     Status: Abnormal   Collection Time: 06/25/21  8:53 PM  Result Value Ref Range   Acetaminophen (Tylenol), Serum <10 (L) 10 - 30 ug/mL    Comment: (NOTE) Therapeutic concentrations vary significantly. A range of 10-30 ug/mL  may be an effective concentration  for many patients. However, some  are best treated at concentrations outside of this range. Acetaminophen concentrations >150 ug/mL at 4 hours after ingestion  and >50 ug/mL at 12 hours after ingestion are often associated with  toxic reactions.  Performed at [facility], [address] [place], [place] [address]   cbc     Status: Abnormal   Collection Time: 06/25/21  8:53 PM  Result Value Ref Range   WBC 11.8 (H) 4.0 - 10.5 K/uL   RBC 5.50 (H) 3.87 - 5.11 MIL/uL   Hemoglobin 15.9 (H) 12.0 - 15.0 g/dL   HCT 60.4 (H) 54.0 - 98.1 %   MCV 89.3 80.0 - 100.0 fL   MCH 28.9 26.0 - 34.0 pg   MCHC 32.4 30.0 - 36.0 g/dL   RDW 19.1 47.8 - 29.5 %   Platelets 326 150 - 400 K/uL   nRBC 0.0 0.0 - 0.2 %    Comment: Performed at [facility], [address], [place], [place] [address]  Urine Drug Screen, Qualitative     Status: Abnormal   Collection Time: 06/25/21  8:53 PM  Result Value Ref Range   Tricyclic, Ur Screen NONE DETECTED NONE DETECTED   Amphetamines, Ur Screen NONE DETECTED NONE DETECTED   MDMA (Ecstasy)Ur Screen NONE DETECTED NONE DETECTED   Cocaine Metabolite,Ur  NONE DETECTED NONE DETECTED   Opiate, Ur Screen NONE DETECTED NONE DETECTED   Phencyclidine (PCP) Ur S NONE DETECTED NONE DETECTED   Cannabinoid 50 Ng, Ur  POSITIVE (A) NONE DETECTED   Barbiturates, Ur Screen NONE DETECTED NONE DETECTED   Benzodiazepine, Ur Scrn POSITIVE (A) NONE DETECTED   Methadone Scn, Ur NONE DETECTED NONE DETECTED    Comment: (NOTE) Tricyclics  + metabolites, urine    Cutoff 1000 ng/mL Amphetamines + metabolites, urine  Cutoff 1000 ng/mL MDMA (Ecstasy), urine              Cutoff 500 ng/mL Cocaine Metabolite, urine          Cutoff 300 ng/mL Opiate + metabolites, urine        Cutoff 300 ng/mL Phencyclidine (PCP), urine         Cutoff 25 ng/mL Cannabinoid, urine                 Cutoff 50 ng/mL Barbiturates + metabolites, urine  Cutoff 200 ng/mL Benzodiazepine, urine              Cutoff 200 ng/mL Methadone, urine                   Cutoff 300 ng/mL  The urine drug screen provides only a preliminary, unconfirmed analytical test result and should not be used for non-medical purposes. Clinical consideration and professional judgment should be applied to any positive drug screen result due to possible interfering substances. A more specific alternate chemical method must be used in order to obtain a confirmed analytical result. Gas chromatography / mass spectrometry (GC/MS) is the preferred confirm atory method. Performed at [facility], [address] [place], [place] [address]     No current facility-administered medications for this encounter.   No current outpatient medications on file.    Musculoskeletal: Strength & Muscle Tone: within normal limits Gait & Station: normal Patient leans: N/A  Psychiatric Specialty Exam:  Presentation  General Appearance: Appropriate for Environment  Eye Contact:Good  Speech:Clear and Coherent  Speech Volume:Decreased  Handedness:Right   Mood and Affect  Mood:Depressed; Hopeless; Anxious  Affect:Tearful; Blunt; Congruent; Depressed   Thought Process  Thought Processes:Coherent  Descriptions of Associations:Intact  Orientation:Full (Time, Place and Person)  Thought Content:Logical  History of Schizophrenia/Schizoaffective disorder:No data recorded Duration of Psychotic Symptoms:No data recorded Hallucinations:Hallucinations: None  Ideas of  Reference:None  Suicidal Thoughts:Suicidal Thoughts: Yes, Active SI Active Intent and/or Plan: With Intent; With Plan; With Means to Carry Out; With Access to Means  Homicidal Thoughts:Homicidal Thoughts: No   Sensorium  Memory:Immediate Good; Recent Good; Remote Good  Judgment:Poor  Insight:Poor   Executive Functions  Concentration:Fair  Attention Span:Fair  Recall:Fair  Fund of Knowledge:Fair  Language:Fair   Psychomotor Activity  Psychomotor Activity:Psychomotor Activity: Normal   Assets  Assets:Communication Skills; Desire for Improvement; Housing; Health and safety inspector; Resilience; Social Support   Sleep  Sleep:Sleep: Poor   Physical Exam: Physical Exam Vitals and nursing note reviewed.  Constitutional:      Appearance: Normal appearance. She is normal weight.  HENT:     Right Ear: External ear normal.     Left Ear: External ear normal.     Nose: Nose normal.  Cardiovascular:     Rate and Rhythm: Tachycardia present.  Pulmonary:     Effort: Pulmonary effort is normal.  Musculoskeletal:        General: Normal range of motion.     Cervical back: Normal range of motion and neck supple.  Neurological:     General: No focal deficit present.     Mental Status: She is alert and oriented to person, place, and time. Mental status is at baseline.  Psychiatric:        Attention and Perception: Attention and perception normal.        Mood and Affect: Mood is anxious and depressed. Affect is blunt.        Speech: Speech normal.        Behavior: Behavior is withdrawn. Behavior is cooperative.        Thought Content: Thought content includes suicidal ideation. Thought content includes suicidal plan.        Cognition and Memory: Memory normal.        Judgment: Judgment is impulsive.   ROS Blood pressure (!) 150/112, pulse (!) 109, temperature 98.3 F (36.8 C), temperature source Oral, resp. rate 17, height 5\' 3"  (1.6 m), weight 63 kg, SpO2 97 %. Body  mass index is 24.62 kg/m.  Treatment Plan Summary: Plan Patient does not meet criteria for psychiatric inpatient admission  Disposition: Recommend psychiatric Inpatient admission when medically cleared. Supportive therapy provided about ongoing stressors.  , NP 06/26/2021 12:13 AM

## 2021-06-29 ENCOUNTER — Encounter: Payer: Self-pay | Admitting: Unknown Physician Specialty

## 2022-06-25 DIAGNOSIS — Z419 Encounter for procedure for purposes other than remedying health state, unspecified: Secondary | ICD-10-CM | POA: Diagnosis not present

## 2022-07-26 DIAGNOSIS — Z419 Encounter for procedure for purposes other than remedying health state, unspecified: Secondary | ICD-10-CM | POA: Diagnosis not present

## 2022-08-04 ENCOUNTER — Telehealth: Payer: Self-pay

## 2022-08-04 NOTE — Telephone Encounter (Signed)
Mychart msg sent. AS, CMA 

## 2022-10-25 DIAGNOSIS — Z419 Encounter for procedure for purposes other than remedying health state, unspecified: Secondary | ICD-10-CM | POA: Diagnosis not present

## 2022-11-24 DIAGNOSIS — Z419 Encounter for procedure for purposes other than remedying health state, unspecified: Secondary | ICD-10-CM | POA: Diagnosis not present

## 2022-12-21 DIAGNOSIS — Z760 Encounter for issue of repeat prescription: Secondary | ICD-10-CM | POA: Diagnosis not present

## 2022-12-21 DIAGNOSIS — I10 Essential (primary) hypertension: Secondary | ICD-10-CM | POA: Diagnosis not present

## 2022-12-25 DIAGNOSIS — Z419 Encounter for procedure for purposes other than remedying health state, unspecified: Secondary | ICD-10-CM | POA: Diagnosis not present

## 2022-12-29 DIAGNOSIS — F331 Major depressive disorder, recurrent, moderate: Secondary | ICD-10-CM | POA: Diagnosis not present

## 2023-01-24 DIAGNOSIS — Z419 Encounter for procedure for purposes other than remedying health state, unspecified: Secondary | ICD-10-CM | POA: Diagnosis not present

## 2023-01-25 DIAGNOSIS — F3113 Bipolar disorder, current episode manic without psychotic features, severe: Secondary | ICD-10-CM | POA: Diagnosis not present

## 2023-01-26 DIAGNOSIS — F439 Reaction to severe stress, unspecified: Secondary | ICD-10-CM | POA: Diagnosis not present

## 2023-02-02 DIAGNOSIS — F439 Reaction to severe stress, unspecified: Secondary | ICD-10-CM | POA: Diagnosis not present

## 2023-02-15 ENCOUNTER — Telehealth: Payer: Self-pay | Admitting: Family Medicine

## 2023-02-15 ENCOUNTER — Ambulatory Visit (INDEPENDENT_AMBULATORY_CARE_PROVIDER_SITE_OTHER): Payer: Medicaid Other | Admitting: Family Medicine

## 2023-02-15 ENCOUNTER — Encounter: Payer: Self-pay | Admitting: Family Medicine

## 2023-02-15 VITALS — BP 160/99 | HR 98

## 2023-02-15 DIAGNOSIS — E559 Vitamin D deficiency, unspecified: Secondary | ICD-10-CM | POA: Diagnosis not present

## 2023-02-15 DIAGNOSIS — R9431 Abnormal electrocardiogram [ECG] [EKG]: Secondary | ICD-10-CM | POA: Diagnosis not present

## 2023-02-15 DIAGNOSIS — F319 Bipolar disorder, unspecified: Secondary | ICD-10-CM | POA: Diagnosis not present

## 2023-02-15 DIAGNOSIS — R202 Paresthesia of skin: Secondary | ICD-10-CM | POA: Diagnosis not present

## 2023-02-15 DIAGNOSIS — M810 Age-related osteoporosis without current pathological fracture: Secondary | ICD-10-CM

## 2023-02-15 DIAGNOSIS — B009 Herpesviral infection, unspecified: Secondary | ICD-10-CM | POA: Diagnosis not present

## 2023-02-15 DIAGNOSIS — M797 Fibromyalgia: Secondary | ICD-10-CM

## 2023-02-15 DIAGNOSIS — H1013 Acute atopic conjunctivitis, bilateral: Secondary | ICD-10-CM

## 2023-02-15 DIAGNOSIS — J449 Chronic obstructive pulmonary disease, unspecified: Secondary | ICD-10-CM | POA: Diagnosis not present

## 2023-02-15 DIAGNOSIS — R079 Chest pain, unspecified: Secondary | ICD-10-CM | POA: Diagnosis not present

## 2023-02-15 DIAGNOSIS — Z7689 Persons encountering health services in other specified circumstances: Secondary | ICD-10-CM

## 2023-02-15 DIAGNOSIS — I10 Essential (primary) hypertension: Secondary | ICD-10-CM

## 2023-02-15 MED ORDER — VALACYCLOVIR HCL 500 MG PO TABS: 500.0000 mg | ORAL_TABLET | Freq: Every day | ORAL | 3 refills | Status: AC

## 2023-02-15 MED ORDER — LISINOPRIL 20 MG PO TABS
20.0000 mg | ORAL_TABLET | Freq: Every day | ORAL | 1 refills | Status: DC
Start: 2023-02-15 — End: 2023-03-11

## 2023-02-15 MED ORDER — BUDESONIDE-FORMOTEROL FUMARATE 160-4.5 MCG/ACT IN AERO
2.0000 | INHALATION_SPRAY | Freq: Two times a day (BID) | RESPIRATORY_TRACT | 1 refills | Status: DC
Start: 2023-02-15 — End: 2023-04-13

## 2023-02-15 MED ORDER — AZELASTINE HCL 0.05 % OP SOLN: 1.0000 [drp] | Freq: Two times a day (BID) | OPHTHALMIC | 12 refills | Status: DC

## 2023-02-15 MED ORDER — AMLODIPINE BESYLATE 5 MG PO TABS
5.0000 mg | ORAL_TABLET | Freq: Every day | ORAL | 1 refills | Status: DC
Start: 2023-02-15 — End: 2023-08-05

## 2023-02-15 NOTE — Telephone Encounter (Signed)
Covermymeds is requesting prior authorization Key: [id number] Name: Valerie Goodman 160-4.5MCG/ACT Aerosol

## 2023-02-15 NOTE — Assessment & Plan Note (Signed)
History of vitamin D deficiency. Will check today.

## 2023-02-15 NOTE — Assessment & Plan Note (Signed)
Patient endorses numbness/tingling to her lower extremities. Will check B12 level as noted below.

## 2023-02-15 NOTE — Telephone Encounter (Signed)
Covermymeds is requesting prior authorization Key: [id number] Name: Beiser Azelastine HCI 0.05%

## 2023-02-15 NOTE — Progress Notes (Signed)
New patient visit   Patient: Valerie Goodman   DOB: Jun 14, 1961   62 y.o. Female  MRN: [id number] Visit Date: 02/15/2023  Today's healthcare provider: Sherlyn Hay, DO   Chief Complaint  Patient presents with   New Patient (Initial Visit)    Patient reports concerns of frequent herpes outbreaks. Patient reports she is currently having an outbreak and typically last. States lately she feels if she has been in the sun "real good" she continues to have them. Her last outbreak before the current was last month. Patient is not taking anything for treatment   Subjective    Valerie Goodman is a 62 y.o. female who presents today as a new patient to establish care.  HPI HPI     New Patient (Initial Visit)    Additional comments: Patient reports concerns of frequent herpes outbreaks. Patient reports she is currently having an outbreak and typically last. States lately she feels if she has been in the sun "real good" she continues to have them. Her last outbreak before the current was last month. Patient is not taking anything for treatment      Last edited by Acey Lav, CMA on 02/15/2023  1:26 PM.     Establishing care: - Has been in-between providers - Had to go to walk-in clinic and got a 30-day supply several months ago and has been alternating meds  Herpes outbreak:  - Current outbreak around anus, going on for two days. - Usually occur once every three months - Lately, it has been every 3-4 weeks since April. - Does her mother's yardwork; being in the sun seems to precipitate outbreaks  Tobacco: - Had quit for ten years, restarted in 2014 - Smoking 1/2 ppd; cut back a lot and smoking ultra-lights  Mental health: - Goes to [facility] - restarted wellbutrin, seroquel  COPD: Used Dulera before with no problems, no others she can think of.  Health maintenance: HIV/HCV screening - screened for both previously received treatment for hepatitis c F2-F3 fibrosis on liver ultrasound  - takes milk thistle Mammogram - ordering today Tdap vaccine - wants vaccine, will hold off until herpes outbreak resolves Hepatitis B vaccine - received hepatitis A and B vaccine (believes she's received the whole series). Shingrix vaccine - would like to receive it, will hold off until herpes outbreak resolves COVID-vaccine - received two previous injections; had trouble walking for six weeks and had to use a small shopping cart from the place she worked at to get around at work. Does not want any more Covid vaccines. Pap smear - had one 2 or more years ago; record shows 2018.  Significant hx abnormals with prior colposcopy. Last in 2018 NGIL and positive for unspecified HPV Colonoscopy - due 09/11/2025    Past Medical History:  Diagnosis Date   Abnormal weight gain    Allergy    Anxiety    Arthritis    Asthma    Bipolar 1 disorder (HCC)    Cancer (HCC)    Cervical pain    Chronic urticaria    Complication of anesthesia    wakes up during procedures, hard to sedate   COPD (chronic obstructive pulmonary disease) (HCC)    Depression    Depression, major, single episode, moderate (HCC) 05/20/2017   HCV (hepatitis C virus)    Hypertension    Low back pain    Menopause    Mood disorder (HCC)    Neuromuscular disorder (HCC)    Osteoporosis  Stress fracture    Past Surgical History:  Procedure Laterality Date   COLONOSCOPY WITH PROPOFOL N/A 09/12/2015   Procedure: COLONOSCOPY WITH PROPOFOL;  Surgeon: Midge Minium, MD;  [facility];  Service: Endoscopy;  Laterality: N/A;   TUBAL LIGATION     WISDOM TOOTH EXTRACTION     Family Status  Relation Name Status   Mother  Alive   Father  Deceased at age 70   Son  Alive   Son  Chemical engineer  (Not Specified)   Youth worker  (Not Specified)   MGM  Deceased   MGF  Deceased   PGM  Deceased   PGF  Deceased  No partnership data on file   Family History  Problem Relation Age of Onset   Heart Problems Mother         uterine   Arthritis Mother    Breast cancer Mother 43   Cancer Father 28   Mental illness Father        bipolar   Heart Problems Father    Hypertension Son    Asthma Son    Cancer Son        melanoma   Breast cancer Maternal Aunt 70       55   Breast cancer Maternal Aunt    Polymyositis Maternal Grandmother    Lung cancer Maternal Grandfather    Heart disease Paternal Grandmother        MI   Brain cancer Paternal Grandfather    Social History   Socioeconomic History   Marital status: Single    Spouse name: Not on file   Number of children: Not on file   Years of education: Not on file   Highest education level: Not on file  Occupational History   Not on file  Tobacco Use   Smoking status: Every Day    Current packs/day: 0.50    Types: Cigarettes   Smokeless tobacco: Never  Vaping Use   Vaping status: Never Used  Substance and Sexual Activity   Alcohol use: Yes    Comment: drinks 2 days a week typically; 2 shots liqour and 2 budlight 06/25/21   Drug use: Yes    Types: Marijuana   Sexual activity: Yes  Other Topics Concern   Not on file  Social History Narrative   ** Merged History Encounter **       Social Determinants of Health   Financial Resource Strain: Medium Risk (06/26/2021)   Received from [facility]   Overall Financial Resource Strain (CARDIA)    Difficulty of Paying Living Expenses: Somewhat hard  Food Insecurity: Not on file  Transportation Needs: Not on file  Physical Activity: Not on file  Stress: Stress Concern Present (06/26/2021)   Received from [facility], [facility]   [employer] [facility] - Occupational Stress Questionnaire    Feeling of Stress : Rather much  Social Connections: Unknown (12/08/2021)   Received from [facility], [facility]   Social Network    Social Network: Not on file   Outpatient Medications Prior to Visit  Medication Sig Note   albuterol (PROVENTIL HFA;VENTOLIN HFA)  108 (90 BASE) MCG/ACT inhaler Inhale 2 puffs into the lungs every 6 (six) hours as needed for wheezing or shortness of breath.    buPROPion (WELLBUTRIN XL) 300 MG 24 hr tablet Take 300 mg by mouth daily.    [DISCONTINUED] amLODipine (NORVASC) 5 MG tablet Take 1 tablet (  5 mg total) by mouth daily.    [DISCONTINUED] lisinopril (ZESTRIL) 20 MG tablet Take 20 mg by mouth daily.    [DISCONTINUED] albuterol (VENTOLIN HFA) 108 (90 Base) MCG/ACT inhaler Inhale 2 puffs into the lungs every 4 (four) hours as needed. (Patient not taking: Reported on 02/15/2023)    [DISCONTINUED] azelastine (OPTIVAR) 0.05 % ophthalmic solution Place 1 drop into both eyes 2 (two) times daily. (Patient not taking: Reported on 02/15/2023)    [DISCONTINUED] b complex vitamins tablet Take 1 tablet by mouth daily. (Patient not taking: Reported on 02/15/2023)    [DISCONTINUED] budesonide-formoterol (SYMBICORT) 160-4.5 MCG/ACT inhaler Inhale 2 puffs into the lungs 2 (two) times daily. (Patient not taking: Reported on 02/15/2023)    [DISCONTINUED] DULoxetine (CYMBALTA) 60 MG capsule Take 1 capsule (60 mg total) by mouth daily. (Patient not taking: Reported on 02/15/2023)    [DISCONTINUED] ibandronate (BONIVA) 150 MG tablet Take 1 tablet (150 mg total) by mouth every 30 (thirty) days. Take in the morning with a full glass of water, on an empty stomach. (Patient not taking: Reported on 02/15/2023) 02/15/2023: significant stomach upset   [DISCONTINUED] ibuprofen (ADVIL,MOTRIN) 200 MG tablet Take 400 mg by mouth every 6 (six) hours as needed. (Patient not taking: Reported on 02/15/2023)    [DISCONTINUED] LORazepam (ATIVAN) 1 MG tablet Take 1 mg by mouth at bedtime. (Patient not taking: Reported on 02/15/2023)    [DISCONTINUED] losartan-hydrochlorothiazide (HYZAAR) 100-25 MG tablet Take 1 tablet by mouth daily. (Patient not taking: Reported on 02/15/2023)    [DISCONTINUED] Multiple Vitamin (MULTIVITAMIN) tablet Take 1 tablet by mouth daily. (Patient not  taking: Reported on 02/15/2023)    [DISCONTINUED] UNABLE TO FIND COMPOUNDED MEDICATION  naltrexone 1.5mg  tab  take 1 tab po qhs x 1week, then 2 tabs qhs x 1 week, then 3 tabs qhs and continue (Patient not taking: Reported on 02/15/2023)    No facility-administered medications prior to visit.   Allergies  Allergen Reactions   Mobic [Meloxicam] Anaphylaxis   Mobic [Meloxicam] Swelling    Immunization History  Administered Date(s) Administered   Influenza,inj,Quad PF,6+ Mos 10/18/2016, 05/20/2017   Pneumococcal Polysaccharide-23 03/18/2005   Td 08/14/2007    Health Maintenance  Topic Date Due   DTaP/Tdap/Td (2 - Tdap) 08/13/2017   MAMMOGRAM  11/13/2017   PAP SMEAR-Modifier  02/25/2023 (Originally 04/19/2020)   Zoster Vaccines- Shingrix (1 of 2) 02/25/2023 (Originally 04/24/2010)   INFLUENZA VACCINE  02/24/2023   Colonoscopy  09/11/2025   Hepatitis C Screening  Completed   HIV Screening  Completed   HPV VACCINES  Aged Out   COVID-19 Vaccine  Discontinued    Patient Care Team: Sigurd Pugh, Monico Blitz, DO as PCP - General (Family Medicine) Gabriel Cirri, NP (Inactive) (Nurse Practitioner)  Review of Systems  Constitutional:  Negative for appetite change, chills, fatigue and fever.  Eyes:  Positive for itching.  Respiratory:  Negative for chest tightness and shortness of breath.   Cardiovascular:  Positive for chest pain (intermittent, occurs with anxiety). Negative for palpitations.  Gastrointestinal:  Negative for abdominal pain, nausea and vomiting.  Skin:  Positive for rash. Negative for color change, pallor and wound.  Allergic/Immunologic: Positive for environmental allergies.  Neurological:  Negative for dizziness and weakness.         Objective    BP (!) 160/99 (BP Location: Left Arm, Patient Position: Sitting, Cuff Size: Normal)   Pulse 98   LMP  (LMP Unknown)      Physical Exam Vitals and nursing note reviewed.  Constitutional:      General: She is not in  acute distress.    Appearance: Normal appearance.  HENT:     Head: Normocephalic and atraumatic.  Eyes:     General: No scleral icterus.    Conjunctiva/sclera: Conjunctivae normal.  Cardiovascular:     Rate and Rhythm: Normal rate.  Pulmonary:     Effort: Pulmonary effort is normal.  Neurological:     Mental Status: She is alert and oriented to person, place, and time. Mental status is at baseline.  Psychiatric:        Mood and Affect: Mood normal.        Behavior: Behavior normal.     Depression Screen    06/21/2017   10:55 AM 05/20/2017   11:28 AM 04/19/2017   10:08 AM 07/08/2015    8:25 AM  PHQ 2/9 Scores  PHQ - 2 Score 5 4 6  0  PHQ- 9 Score 20 16 23       Assessment & Plan     Establishing care with new doctor, encounter for -     3D Screening Mammogram, Left and Right; Future -     TSH Rfx on Abnormal to Free T4  Chronic obstructive pulmonary disease, unspecified COPD type [facility]) Assessment & Plan: Patient believes she was previously on [facility]; however, her record shows Symbicort.  Will restart patient on Symbicort as noted below.    Orders: -     Budesonide-Formoterol Fumarate; Inhale 2 puffs into the lungs 2 (two) times daily.  Dispense: 10.2 each; Refill: 1  Chest pain, unspecified type Assessment & Plan: Patient endorses intermittent chest pain which typically occurs with anxiety.  She did bring a record with her which has an auto-read that suggest anterior ischemia needs to be considered as a possible source.  I am able to locate the same information in Care Everywhere, but I am not able to see the EKG. EKG performed today as noted below.  Orders: -     EKG 12-Lead  Abnormal ECG Assessment & Plan: EKG performed today demonstrates normal sinus rhythm with an ST and T wave abnormality.  Rate of 95, PR interval 138, QRS 74 and QT/QTc 354/444.  This seems to be consistent with her previous EKG from 2 years ago and is unlikely to be an acute  problem.  Orders: -     EKG 12-Lead  Primary hypertension Assessment & Plan: Patient's blood pressure is elevated at this visit.  However, she has been on suboptimal treatment due to inconsistent care in the past few months and has not been on her full antihypertensive regimen.  Will refill her medications (amlodipine 5 mg daily and lisinopril 20 mg daily) today and reassess at follow-up.  Will also check lab work as noted.  Orders: -     EKG 12-Lead -     Microalbumin / creatinine urine ratio -     Comprehensive metabolic panel -     Lipid panel -     Lisinopril; Take 1 tablet (20 mg total) by mouth daily.  Dispense: 30 tablet; Refill: 1 -     amLODIPine Besylate; Take 1 tablet (5 mg total) by mouth daily.  Dispense: 90 tablet; Refill: 1  Herpes simplex type 2 infection Assessment & Plan: Due to patient's frequent outbreaks, will prescribe Valtrex as noted.  Advised patient that we will continue this for up to 12 months and then we will have to reassess at that point.  Orders: -  valACYclovir HCl; Take 1 tablet (500 mg total) by mouth daily.  Dispense: 90 tablet; Refill: 3  Bipolar 1 disorder (HCC) Assessment & Plan: Currently being managed by [facility] and restarted on Wellbutrin and Seroquel with initial improvement.  Will defer to their management.   Fibromyalgia Assessment & Plan: Patient previously on duloxetine without relief.  Patient reports her pain is manageable at this time; did not start any new medications today.  Will reassess on follow-up visit.   Osteoporosis, unspecified osteoporosis type, unspecified pathological fracture presence  Vitamin D deficiency Assessment & Plan: History of vitamin D deficiency. Will check today.  Orders: -     VITAMIN D 25 Hydroxy (Vit-D Deficiency, Fractures)  Paresthesias Assessment & Plan: Patient endorses numbness/tingling to her lower extremities. Will check B12 level as noted below.  Orders: -     Vitamin B12  Allergic  conjunctivitis of both eyes -     Azelastine HCl; Place 1 drop into both eyes 2 (two) times daily.  Dispense: 6 mL; Refill: 12    Return in about 10 days (around 02/25/2023) for HTN, PAP.     I discussed the assessment and treatment plan with the patient  The patient was provided an opportunity to ask questions and all were answered. The patient agreed with the plan and demonstrated an understanding of the instructions.   The patient was advised to call back or seek an in-person evaluation if the symptoms worsen or if the condition fails to improve as anticipated.  Total time was 75 minutes. That includes chart review before the visit, the actual patient visit, and time spent on documentation after the visit.    Sherlyn Hay, DO  [facility] [phone] (phone) [phone] (fax)  [facility]

## 2023-02-16 LAB — TSH RFX ON ABNORMAL TO FREE T4: TSH: 1.26 u[IU]/mL (ref 0.450–4.500)

## 2023-02-16 LAB — LIPID PANEL
Chol/HDL Ratio: 3.9 ratio (ref 0.0–4.4)
Cholesterol, Total: 224 mg/dL — ABNORMAL HIGH (ref 100–199)
HDL: 58 mg/dL (ref >39)
LDL Chol Calc (NIH): 118 mg/dL — ABNORMAL HIGH (ref 0–99)
Triglycerides: 279 mg/dL — ABNORMAL HIGH (ref 0–149)
VLDL Cholesterol Cal: 48 mg/dL — ABNORMAL HIGH (ref 5–40)

## 2023-02-16 LAB — COMPREHENSIVE METABOLIC PANEL
ALT: 22 IU/L (ref 0–32)
AST: 19 IU/L (ref 0–40)
Albumin: 4.7 g/dL (ref 3.9–4.9)
Alkaline Phosphatase: 144 IU/L — ABNORMAL HIGH (ref 44–121)
BUN/Creatinine Ratio: 9 — ABNORMAL LOW (ref 12–28)
BUN: 8 mg/dL (ref 8–27)
Bilirubin Total: 0.2 mg/dL (ref 0.0–1.2)
CO2: 22 mmol/L (ref 20–29)
Calcium: 9.9 mg/dL (ref 8.7–10.3)
Chloride: 102 mmol/L (ref 96–106)
Creatinine, Ser: 0.88 mg/dL (ref 0.57–1.00)
Globulin, Total: 2.6 g/dL (ref 1.5–4.5)
Glucose: 90 mg/dL (ref 70–99)
Potassium: 4.5 mmol/L (ref 3.5–5.2)
Sodium: 140 mmol/L (ref 134–144)
Total Protein: 7.3 g/dL (ref 6.0–8.5)
eGFR: 74 mL/min/{1.73_m2} (ref >59)

## 2023-02-16 LAB — MICROALBUMIN / CREATININE URINE RATIO
Creatinine, Urine: 119.6 mg/dL
Microalb/Creat Ratio: 4 mg/g creat (ref 0–29)
Microalbumin, Urine: 4.7 ug/mL

## 2023-02-16 LAB — VITAMIN B12: Vitamin B-12: 873 pg/mL (ref 232–1245)

## 2023-02-16 LAB — VITAMIN D 25 HYDROXY (VIT D DEFICIENCY, FRACTURES): Vit D, 25-Hydroxy: 39.4 ng/mL (ref 30.0–100.0)

## 2023-02-18 ENCOUNTER — Other Ambulatory Visit: Payer: Self-pay | Admitting: Family Medicine

## 2023-02-23 ENCOUNTER — Encounter: Payer: Self-pay | Admitting: Family Medicine

## 2023-02-23 NOTE — Assessment & Plan Note (Addendum)
Patient previously on duloxetine without relief.  Patient reports her pain is manageable at this time; did not start any new medications today.  Will reassess on follow-up visit.

## 2023-02-23 NOTE — Assessment & Plan Note (Signed)
Due to patient's frequent outbreaks, will prescribe Valtrex as noted.  Advised patient that we will continue this for up to 12 months and then we will have to reassess at that point.

## 2023-02-23 NOTE — Assessment & Plan Note (Signed)
Patient's blood pressure is elevated at this visit.  However, she has been on suboptimal treatment due to inconsistent care in the past few months and has not been on her full antihypertensive regimen.  Will refill her medications (amlodipine 5 mg daily and lisinopril 20 mg daily) today and reassess at follow-up.  Will also check lab work as noted.

## 2023-02-23 NOTE — Assessment & Plan Note (Addendum)
EKG performed today demonstrates normal sinus rhythm with an ST and T wave abnormality.  Rate of 95, PR interval 138, QRS 74 and QT/QTc 354/444.  This seems to be consistent with her previous EKG from 2 years ago and is unlikely to be an acute problem.

## 2023-02-23 NOTE — Assessment & Plan Note (Signed)
Currently being managed by [facility] and restarted on Wellbutrin and Seroquel with initial improvement.  Will defer to their management.

## 2023-02-23 NOTE — Assessment & Plan Note (Signed)
Patient endorses intermittent chest pain which typically occurs with anxiety.  She did bring a record with her which has an auto-read that suggest anterior ischemia needs to be considered as a possible source.  I am able to locate the same information in Care Everywhere, but I am not able to see the EKG. EKG performed today as noted below.

## 2023-02-23 NOTE — Assessment & Plan Note (Addendum)
Patient believes she was previously on [facility]; however, her record shows Symbicort.  Will restart patient on Symbicort as noted below.

## 2023-02-24 DIAGNOSIS — Z419 Encounter for procedure for purposes other than remedying health state, unspecified: Secondary | ICD-10-CM | POA: Diagnosis not present

## 2023-02-25 DIAGNOSIS — F3113 Bipolar disorder, current episode manic without psychotic features, severe: Secondary | ICD-10-CM | POA: Diagnosis not present

## 2023-02-25 MED ORDER — OLOPATADINE HCL 0.1 % OP SOLN
1.0000 [drp] | Freq: Two times a day (BID) | OPHTHALMIC | 3 refills | Status: DC
Start: 2023-02-25 — End: 2023-04-08

## 2023-03-03 ENCOUNTER — Telehealth: Payer: Self-pay | Admitting: Family Medicine

## 2023-03-03 ENCOUNTER — Telehealth: Payer: Self-pay

## 2023-03-03 NOTE — Telephone Encounter (Signed)
Pt given EKG results per notes of Dr. Payton Mccallum on 03/03/23. Pt verbalized understanding.

## 2023-03-03 NOTE — Telephone Encounter (Signed)
-----   Message from Valerie Goodman sent at 03/02/2023  3:32 PM EDT ----- EKG performed today demonstrates normal sinus rhythm with an ST and T wave abnormality.  Rate of 95, PR interval 138, QRS 74 and QT/QTc 354/444.  This seems to be consistent with her previous EKG from 2 years ago and is unlikely to be an acute problem.   She can follow up with cardiology; and unsure if symptoms will occur. She needs to try the medication to see. The 10-year ASCVD risk score (Arnett DK, et al., 2019) is: 17%

## 2023-03-11 ENCOUNTER — Other Ambulatory Visit (HOSPITAL_COMMUNITY)
Admission: RE | Admit: 2023-03-11 | Discharge: 2023-03-11 | Disposition: A | Discharge status: Home / Self Care | Payer: Medicaid Other | Source: Ambulatory Visit | Attending: Family Medicine | Admitting: Family Medicine

## 2023-03-11 ENCOUNTER — Encounter: Payer: Self-pay | Admitting: Family Medicine

## 2023-03-11 ENCOUNTER — Ambulatory Visit: Payer: Medicaid Other | Admitting: Family Medicine

## 2023-03-11 VITALS — BP 151/90 | HR 93 | Temp 98.6°F | Ht 63.0 in | Wt 159.0 lb

## 2023-03-11 DIAGNOSIS — Z01419 Encounter for gynecological examination (general) (routine) without abnormal findings: Secondary | ICD-10-CM | POA: Insufficient documentation

## 2023-03-11 DIAGNOSIS — J449 Chronic obstructive pulmonary disease, unspecified: Secondary | ICD-10-CM

## 2023-03-11 DIAGNOSIS — H1013 Acute atopic conjunctivitis, bilateral: Secondary | ICD-10-CM | POA: Diagnosis not present

## 2023-03-11 DIAGNOSIS — N898 Other specified noninflammatory disorders of vagina: Secondary | ICD-10-CM | POA: Diagnosis not present

## 2023-03-11 DIAGNOSIS — M797 Fibromyalgia: Secondary | ICD-10-CM | POA: Diagnosis not present

## 2023-03-11 DIAGNOSIS — R1011 Right upper quadrant pain: Secondary | ICD-10-CM | POA: Diagnosis not present

## 2023-03-11 DIAGNOSIS — B009 Herpesviral infection, unspecified: Secondary | ICD-10-CM | POA: Diagnosis not present

## 2023-03-11 DIAGNOSIS — I10 Essential (primary) hypertension: Secondary | ICD-10-CM | POA: Diagnosis not present

## 2023-03-11 DIAGNOSIS — F17200 Nicotine dependence, unspecified, uncomplicated: Secondary | ICD-10-CM | POA: Insufficient documentation

## 2023-03-11 MED ORDER — LISINOPRIL 40 MG PO TABS
40.0000 mg | ORAL_TABLET | Freq: Every day | ORAL | 3 refills | Status: DC
Start: 2023-03-11 — End: 2024-03-20

## 2023-03-11 NOTE — Assessment & Plan Note (Signed)
Well woman exam overall abnormal, except for some vaginal discharge as noted during exam.  Sample for Pap smear and ancillary testing obtained.  Will contact patient with results.

## 2023-03-11 NOTE — Assessment & Plan Note (Signed)
Patient's blood pressure is elevated today.  However, she is having fibromyalgia pain in her arms bilaterally which is exacerbated by the tightening of the blood pressure cuff.  It is unclear if her current elevation accurately reflects her blood pressure.  She endorses that her blood pressures at home have also been elevated.  Discussed options with patient and we will go ahead and increase patient's lisinopril to 40 mg daily as noted below.  Patient also continue amlodipine 5 mg daily.

## 2023-03-11 NOTE — Assessment & Plan Note (Signed)
Patient was previously smoking 1 pack/day but is down to 7 to 10 cigarettes/day now.  She is continuing Wellbutrin under the direction of her psychiatrist.  Will defer to psychiatry regarding Wellbutrin.  Did counsel patient on resources available for quitting smoking and advised her to let me know if she would like to try any other alternative medication options.

## 2023-03-11 NOTE — Assessment & Plan Note (Signed)
Due to complexity of today's visit, did not have time to address this problem today.  Will revisit this at her next appointment.

## 2023-03-11 NOTE — Assessment & Plan Note (Signed)
Patient was able to get Symbicort filled and is using it with good relief of symptoms.  No changes to plan.

## 2023-03-11 NOTE — Assessment & Plan Note (Signed)
Patient is doing well on the valacyclovir.  Will continue for 1 year as planned, then stop and see how patient does at that point.

## 2023-03-11 NOTE — Assessment & Plan Note (Signed)
Patient has a very upper quadrant pain over the past 6 months without nausea, vomiting, constipation or diarrhea.  Will order right upper quadrant ultrasound as noted below.

## 2023-03-11 NOTE — Patient Instructions (Addendum)
  Advised patient to check with her insurance to see if they cover the following vaccines in clinic or if she should get it at a local pharmacy:  - Shingles (Shingrix)  - Tdap (tetanus, diphtheria and pertussis)

## 2023-03-11 NOTE — Assessment & Plan Note (Signed)
Patient was unable to get the Patanol eyedrops due to insurance not covering it.  The name of this medication was written down for her, so she may obtain it over-the-counter.

## 2023-03-11 NOTE — Progress Notes (Signed)
Established patient visit   Patient: Valerie Goodman   DOB: 1960-10-02   62 y.o. Female  MRN: [id number] Visit Date: 03/11/2023  Today's healthcare provider: Sherlyn Hay, DO   Chief Complaint  Patient presents with   Hypertension   Gynecologic Exam   Allergic conjunctivitis    Patient was unable to get the Patanol due to insurance coverage.     Subjective    HPI Hypertension, follow-up  BP Readings from Last 3 Encounters:  03/11/23 (!) 151/90  02/15/23 (!) 160/99  06/26/21 (!) 138/113   Wt Readings from Last 3 Encounters:  03/11/23 159 lb (72.1 kg)  06/25/21 139 lb (63 kg)  06/21/17 145 lb (65.8 kg)     She was last seen for hypertension 10 days ago.  BP at that visit was 150/99. Management since that visit includes amlodipine 5 mg daily and lisinopril 20 mg daily.  She reports excellent compliance with treatment. She is not having side effects.  She is following a Regular diet. She is not exercising. She does smoke.  Use of agents associated with hypertension: none.   Outside blood pressures are similar to today's blood pressure.  Patient endorses that her arms are feeling very sensitive in relation to her fibromyalgia, so it hurts more than it would otherwise to have her blood pressure checked.  Symptoms: No chest pain No chest pressure  No palpitations No syncope  No dyspnea No orthopnea  No paroxysmal nocturnal dyspnea No lower extremity edema  Ocassional swelling in feet/ankles  Pertinent labs Lab Results  Component Value Date   CHOL 224 (H) 02/15/2023   HDL 58 02/15/2023   LDLCALC 118 (H) 02/15/2023   TRIG 279 (H) 02/15/2023   CHOLHDL 3.9 02/15/2023   Lab Results  Component Value Date   NA 140 02/15/2023   K 4.5 02/15/2023   CREATININE 0.88 02/15/2023   EGFR 74 02/15/2023   GLUCOSE 90 02/15/2023   TSH 1.260 02/15/2023     The 10-year ASCVD risk score (Arnett DK, et al., 2019) is:  15.2%  ---------------------------------------------------------------------------------------------------  PAP smear today.  Allergies: Mobic [meloxicam] and Mobic [meloxicam]  No LMP recorded (lmp unknown). Patient is postmenopausal.  ROS:  Feeling well. No dyspnea or chest pain on exertion.  No change in bowel habits, black or bloody stools.  No urinary tract symptoms. GYN ROS: no breast pain or new or enlarging lumps on self exam, no vaginal bleeding, no discharge or pelvic pain. No neurological complaints.  Initially denied abdominal pain but then noted that she has had some tenderness to to her right upper quadrant x 6 months.  No nausea, vomiting, diarrhea or constipation.     Medications: Outpatient Medications Prior to Visit  Medication Sig   albuterol (PROVENTIL HFA;VENTOLIN HFA) 108 (90 BASE) MCG/ACT inhaler Inhale 2 puffs into the lungs every 6 (six) hours as needed for wheezing or shortness of breath.   amLODipine (NORVASC) 5 MG tablet Take 1 tablet (5 mg total) by mouth daily.   budesonide-formoterol (SYMBICORT) 160-4.5 MCG/ACT inhaler Inhale 2 puffs into the lungs 2 (two) times daily.   buPROPion (WELLBUTRIN XL) 300 MG 24 hr tablet Take 300 mg by mouth daily.   diclofenac Sodium (VOLTAREN) 1 % GEL Apply 2 g topically 4 (four) times daily.   lidocaine 4 % Place 1 patch onto the skin daily.   QUEtiapine Fumarate 150 MG TABS Take 1 tablet by mouth at bedtime.   valACYclovir (VALTREX) 500 MG tablet  Take 1 tablet (500 mg total) by mouth daily.   [DISCONTINUED] lisinopril (ZESTRIL) 20 MG tablet Take 1 tablet (20 mg total) by mouth daily.   olopatadine (PATANOL) 0.1 % ophthalmic solution Place 1 drop into both eyes 2 (two) times daily. (Patient not taking: Reported on 03/11/2023)   No facility-administered medications prior to visit.    Review of Systems  Constitutional:  Negative for appetite change, chills, fatigue and fever.  Respiratory:  Negative for chest tightness and  shortness of breath.   Cardiovascular:  Negative for chest pain and palpitations.  Gastrointestinal:  Positive for abdominal pain. Negative for abdominal distention, blood in stool, constipation, diarrhea, nausea, rectal pain and vomiting.  Genitourinary:  Negative for dysuria, flank pain, frequency, hematuria, urgency, vaginal bleeding, vaginal discharge and vaginal pain.  Neurological:  Positive for numbness (sometimes in fingers or toes). Negative for dizziness and weakness.        Objective    BP (!) 151/90 (BP Location: Left Arm, Patient Position: Sitting, Cuff Size: Normal)   Pulse 93   Temp 98.6 F (37 C) (Oral)   Ht 5\' 3"  (1.6 m)   Wt 159 lb (72.1 kg)   LMP  (LMP Unknown)   SpO2 99%   BMI 28.17 kg/m     Physical Exam Vitals and nursing note reviewed.  Constitutional:      General: She is not in acute distress.    Appearance: Normal appearance.  HENT:     Head: Normocephalic and atraumatic.  Eyes:     General: No scleral icterus.    Conjunctiva/sclera: Conjunctivae normal.  Cardiovascular:     Rate and Rhythm: Normal rate.  Pulmonary:     Effort: Pulmonary effort is normal.  Abdominal:     Tenderness: There is abdominal tenderness in the right upper quadrant.  Genitourinary:    Exam position: Lithotomy position.     Pubic Area: No rash.      Labia:        Right: No rash or lesion.        Left: No rash or lesion.      Vagina: Vaginal discharge (yellowish-white moderate quantity) present. No tenderness or bleeding.     Cervix: Normal. No discharge, friability, lesion or cervical bleeding.     Comments: Pt declined bimanual exam Neurological:     Mental Status: She is alert and oriented to person, place, and time. Mental status is at baseline.  Psychiatric:        Mood and Affect: Mood normal.        Behavior: Behavior normal.       No results found for any visits on 03/11/23.  Assessment & Plan    Well woman exam with routine gynecological  exam Assessment & Plan: Well woman exam overall abnormal, except for some vaginal discharge as noted during exam.  Sample for Pap smear and ancillary testing obtained.  Will contact patient with results.  Orders: -     Cytology - PAP  Vaginal discharge -     Cervicovaginal ancillary only  Primary hypertension Assessment & Plan: Patient's blood pressure is elevated today.  However, she is having fibromyalgia pain in her arms bilaterally which is exacerbated by the tightening of the blood pressure cuff.  It is unclear if her current elevation accurately reflects her blood pressure.  She endorses that her blood pressures at home have also been elevated.  Discussed options with patient and we will go ahead and increase patient's lisinopril to  40 mg daily as noted below.  Patient also continue amlodipine 5 mg daily.  Orders: -     Lisinopril; Take 1 tablet (40 mg total) by mouth daily.  Dispense: 90 tablet; Refill: 3  Herpes simplex type 2 infection Assessment & Plan: Patient is doing well on the valacyclovir.  Will continue for 1 year as planned, then stop and see how patient does at that point.   Chronic obstructive pulmonary disease, unspecified COPD type (HCC) Assessment & Plan: Patient was able to get Symbicort filled and is using it with good relief of symptoms.  No changes to plan.   Right upper quadrant abdominal pain Assessment & Plan: Patient has a very upper quadrant pain over the past 6 months without nausea, vomiting, constipation or diarrhea.  Will order right upper quadrant ultrasound as noted below.  Orders: -     US ABDOMEN LIMITED RUQ (LIVER/GB); Future  Nicotine dependence with current use Assessment & Plan: Patient was previously smoking 1 pack/day but is down to 7 to 10 cigarettes/day now.  She is continuing Wellbutrin under the direction of her psychiatrist.  Will defer to psychiatry regarding Wellbutrin.  Did counsel patient on resources available for quitting  smoking and advised her to let me know if she would like to try any other alternative medication options.   Allergic conjunctivitis of both eyes Assessment & Plan: Patient was unable to get the Patanol eyedrops due to insurance not covering it.  The name of this medication was written down for her, so she may obtain it over-the-counter.   Fibromyalgia Assessment & Plan: Due to complexity of today's visit, did not have time to address this problem today.  Will revisit this at her next appointment.     Return in about 4 weeks (around 04/08/2023) for HTN.      I discussed the assessment and treatment plan with the patient  The patient was provided an opportunity to ask questions and all were answered. The patient agreed with the plan and demonstrated an understanding of the instructions.   The patient was advised to call back or seek an in-person evaluation if the symptoms worsen or if the condition fails to improve as anticipated.  Total time was 40 minutes. That includes chart review before the visit, the actual patient visit, and time spent on documentation after the visit.    Sherlyn Hay, DO  [facility] [phone] (phone) [phone] (fax)  [facility]

## 2023-03-15 LAB — CYTOLOGY - PAP
Comment: NEGATIVE
Diagnosis: NEGATIVE
High risk HPV: NEGATIVE

## 2023-03-15 LAB — CERVICOVAGINAL ANCILLARY ONLY
Bacterial Vaginitis (gardnerella): NEGATIVE
Candida Glabrata: NEGATIVE
Candida Vaginitis: NEGATIVE
Comment: NEGATIVE
Comment: NEGATIVE
Comment: NEGATIVE

## 2023-03-17 DIAGNOSIS — F439 Reaction to severe stress, unspecified: Secondary | ICD-10-CM | POA: Diagnosis not present

## 2023-03-23 ENCOUNTER — Ambulatory Visit
Admission: RE | Admit: 2023-03-23 | Discharge: 2023-03-23 | Disposition: A | Discharge status: Home / Self Care | Payer: Medicaid Other | Source: Ambulatory Visit | Attending: Family Medicine | Admitting: Family Medicine

## 2023-03-23 DIAGNOSIS — Z1231 Encounter for screening mammogram for malignant neoplasm of breast: Secondary | ICD-10-CM | POA: Insufficient documentation

## 2023-03-23 DIAGNOSIS — Z7689 Persons encountering health services in other specified circumstances: Secondary | ICD-10-CM | POA: Diagnosis not present

## 2023-03-24 ENCOUNTER — Other Ambulatory Visit: Payer: Self-pay | Admitting: *Deleted

## 2023-03-24 ENCOUNTER — Inpatient Hospital Stay
Admission: RE | Admit: 2023-03-24 | Discharge: 2023-03-24 | Disposition: A | Discharge status: Home / Self Care | Payer: Self-pay | Source: Ambulatory Visit | Attending: Family Medicine | Admitting: Family Medicine

## 2023-03-24 DIAGNOSIS — Z1231 Encounter for screening mammogram for malignant neoplasm of breast: Secondary | ICD-10-CM

## 2023-03-25 ENCOUNTER — Ambulatory Visit
Admission: RE | Admit: 2023-03-25 | Discharge: 2023-03-25 | Disposition: A | Discharge status: Home / Self Care | Payer: Medicaid Other | Source: Ambulatory Visit | Attending: Family Medicine | Admitting: Family Medicine

## 2023-03-25 DIAGNOSIS — R1011 Right upper quadrant pain: Secondary | ICD-10-CM

## 2023-03-25 DIAGNOSIS — B1920 Unspecified viral hepatitis C without hepatic coma: Secondary | ICD-10-CM | POA: Diagnosis not present

## 2023-03-27 DIAGNOSIS — Z419 Encounter for procedure for purposes other than remedying health state, unspecified: Secondary | ICD-10-CM | POA: Diagnosis not present

## 2023-04-01 DIAGNOSIS — F3113 Bipolar disorder, current episode manic without psychotic features, severe: Secondary | ICD-10-CM | POA: Diagnosis not present

## 2023-04-08 ENCOUNTER — Encounter: Payer: Self-pay | Admitting: Family Medicine

## 2023-04-08 ENCOUNTER — Ambulatory Visit: Payer: Medicaid Other | Admitting: Family Medicine

## 2023-04-08 VITALS — BP 138/96 | HR 95 | Temp 98.5°F | Ht 63.0 in | Wt 159.3 lb

## 2023-04-08 DIAGNOSIS — M159 Polyosteoarthritis, unspecified: Secondary | ICD-10-CM

## 2023-04-08 DIAGNOSIS — I10 Essential (primary) hypertension: Secondary | ICD-10-CM

## 2023-04-08 DIAGNOSIS — Z23 Encounter for immunization: Secondary | ICD-10-CM | POA: Diagnosis not present

## 2023-04-08 NOTE — Progress Notes (Signed)
Complete physical exam   Patient: Valerie Goodman   DOB: May 17, 1961   62 y.o. Female  MRN: [id number] Visit Date: 04/08/2023  Today's healthcare provider: Sherlyn Hay, DO   No chief complaint on file.  Subjective    Valerie Goodman is a 62 y.o. female who presents today for a complete physical exam.  She reports consuming a general diet. The patient does not participate in regular exercise at present. She does do yard work at least once per week, weed-eating and digging and taking care of her garden. She generally feels fairly well. She reports sleeping well. She does not have additional problems to discuss today.  HPI   Hypertension: She was last seen for hypertension approximately 4 weeks ago.  Her lisinopril was increased to 20 mg daily at that time, with amlodipine 5 mg daily continued. Home Bps:   110s-140s/70s-90s with one systolic reading of 106. Taking medications consistently: Yes      Missing any doses? No. Experiencing side effects? Has sometimes felt a little woozy and intermittently tired since starting the increased dose of lisinopril.  Arthritis in feet, ankles, knees and hip. (Mostly on the right side)  Quetiapine increased dose is helping with sleep.   Past Medical History:  Diagnosis Date   Abnormal weight gain    Allergy    Anxiety    Arthritis    Asthma    Bipolar 1 disorder (HCC)    Cancer (HCC)    Cervical pain    Chronic urticaria    Complication of anesthesia    wakes up during procedures, hard to sedate   COPD (chronic obstructive pulmonary disease) (HCC)    Depression    Depression, major, single episode, moderate (HCC) 05/20/2017   HCV (hepatitis C virus)    Hypertension    Low back pain    Menopause    Mood disorder (HCC)    Neuromuscular disorder (HCC)    Osteoporosis    Stress fracture    Past Surgical History:  Procedure Laterality Date   COLONOSCOPY WITH PROPOFOL N/A 09/12/2015   Procedure: COLONOSCOPY WITH PROPOFOL;   Surgeon: Midge Minium, MD;  [facility];  Service: Endoscopy;  Laterality: N/A;   TUBAL LIGATION     WISDOM TOOTH EXTRACTION     Social History   Socioeconomic History   Marital status: Single    Spouse name: Not on file   Number of children: Not on file   Years of education: Not on file   Highest education level: Not on file  Occupational History   Not on file  Tobacco Use   Smoking status: Every Day    Current packs/day: 0.50    Types: Cigarettes   Smokeless tobacco: Never  Vaping Use   Vaping status: Never Used  Substance and Sexual Activity   Alcohol use: Yes    Comment: drinks 2 days a week typically; 2 shots liqour and 2 budlight 06/25/21   Drug use: Yes    Types: Marijuana   Sexual activity: Yes  Other Topics Concern   Not on file  Social History Narrative   ** Merged History Encounter **       Social Determinants of Health   Financial Resource Strain: Medium Risk (06/26/2021)   Received from [employer], [facility]   Overall Financial Resource Strain (CARDIA)    Difficulty of Paying Living Expenses: Somewhat hard  Food Insecurity: Not on file  Transportation Needs: Not on file  Physical Activity: Not on file  Stress: Stress Concern Present (06/26/2021)   Received from [facility], [facility] - Occupational Stress Questionnaire    Feeling of Stress : Rather much  Social Connections: Unknown (12/08/2021)   Received from [facility], [facility]   Social Network    Social Network: Not on file  Intimate Partner Violence: Unknown (10/30/2021)   Received from [facility], [facility]   HITS    Physically Hurt: Not on file    Insult or Talk Down To: Not on file    Threaten Physical Harm: Not on file    Scream or Curse: Not on file   Family Status  Relation Name Status   Mother  Alive   Father  Deceased at age 77   Son  Alive   Son  Alive   Mat Aunt  (Not Specified)   Youth worker   (Not Specified)   MGM  Deceased   MGF  Deceased   PGM  Deceased   PGF  Deceased  No partnership data on file   Family History  Problem Relation Age of Onset   Heart Problems Mother        uterine   Arthritis Mother    Breast cancer Mother 80   Cancer Father 42   Mental illness Father        bipolar   Heart Problems Father    Hypertension Son    Asthma Son    Cancer Son        melanoma   Breast cancer Maternal Aunt 70       55   Breast cancer Maternal Aunt    Polymyositis Maternal Grandmother    Lung cancer Maternal Grandfather    Heart disease Paternal Grandmother        MI   Brain cancer Paternal Grandfather    Allergies  Allergen Reactions   Mobic [Meloxicam] Anaphylaxis   Mobic [Meloxicam] Swelling    Patient Care Team: Zerek Litsey, Monico Blitz, DO as PCP - General (Family Medicine) Gabriel Cirri, NP (Inactive) (Nurse Practitioner)   Medications: Outpatient Medications Prior to Visit  Medication Sig   albuterol (PROVENTIL HFA;VENTOLIN HFA) 108 (90 BASE) MCG/ACT inhaler Inhale 2 puffs into the lungs every 6 (six) hours as needed for wheezing or shortness of breath.   amLODipine (NORVASC) 5 MG tablet Take 1 tablet (5 mg total) by mouth daily.   buPROPion (WELLBUTRIN XL) 300 MG 24 hr tablet Take 300 mg by mouth daily.   diclofenac Sodium (VOLTAREN) 1 % GEL Apply 2 g topically 4 (four) times daily.   lidocaine 4 % Place 1 patch onto the skin daily.   lisinopril (ZESTRIL) 40 MG tablet Take 1 tablet (40 mg total) by mouth daily.   QUEtiapine Fumarate 150 MG TABS Take 1 tablet by mouth at bedtime.   RED YEAST RICE EXTRACT PO Take 500 mg by mouth daily.   valACYclovir (VALTREX) 500 MG tablet Take 1 tablet (500 mg total) by mouth daily.   [DISCONTINUED] budesonide-formoterol (SYMBICORT) 160-4.5 MCG/ACT inhaler Inhale 2 puffs into the lungs 2 (two) times daily.   [DISCONTINUED] olopatadine (PATANOL) 0.1 % ophthalmic solution Place 1 drop into both eyes 2 (two) times daily.  (Patient not taking: Reported on 03/11/2023)   No facility-administered medications prior to visit.    Review of Systems  Constitutional:  Positive for fatigue. Negative for chills and fever.  HENT:  Negative for congestion, ear pain, rhinorrhea,  sneezing and sore throat.   Eyes: Negative.  Negative for pain and redness.  Respiratory:  Positive for shortness of breath (dyspnea on exertion (going up a flight of stairs)). Negative for cough and wheezing.   Cardiovascular:  Negative for chest pain and leg swelling.  Gastrointestinal:  Positive for abdominal pain. Negative for blood in stool, constipation, diarrhea and nausea.  Endocrine: Negative for polydipsia and polyphagia.  Genitourinary: Negative.  Negative for dysuria, flank pain, hematuria, pelvic pain, vaginal bleeding and vaginal discharge.  Musculoskeletal:  Positive for arthralgias and joint swelling (ankle/foot with her arthritis bothers her). Negative for back pain and gait problem.  Skin:  Negative for rash.  Neurological:  Positive for numbness (intermittently, pt attributes ot fibromyalgia). Negative for dizziness, tremors, seizures, weakness, light-headedness and headaches.  Hematological:  Negative for adenopathy.  Psychiatric/Behavioral:  Positive for dysphoric mood. Negative for behavioral problems and confusion. The patient is nervous/anxious. The patient is not hyperactive.       Objective    BP (!) 138/96 (BP Location: Right Arm, Patient Position: Sitting, Cuff Size: Normal)   Pulse 95   Temp 98.5 F (36.9 C) (Oral)   Ht 5\' 3"  (1.6 m)   Wt 159 lb 4.8 oz (72.3 kg)   LMP  (LMP Unknown)   SpO2 96%   BMI 28.22 kg/m    Physical Exam Vitals and nursing note reviewed.  Constitutional:      General: She is not in acute distress.    Appearance: Normal appearance.  HENT:     Head: Normocephalic and atraumatic.  Eyes:     General: No scleral icterus.    Conjunctiva/sclera: Conjunctivae normal.  Cardiovascular:      Rate and Rhythm: Normal rate.  Pulmonary:     Effort: Pulmonary effort is normal.  Neurological:     Mental Status: She is alert and oriented to person, place, and time. Mental status is at baseline.  Psychiatric:        Mood and Affect: Mood normal.        Behavior: Behavior normal.      Last depression screening scores    04/08/2023    1:41 PM 03/11/2023    2:41 PM 06/21/2017   10:55 AM  PHQ 2/9 Scores  PHQ - 2 Score 3 2 5   PHQ- 9 Score 11 8 20    Last fall risk screening    04/08/2023    2:14 PM  Fall Risk   Falls in the past year? 0  Number falls in past yr: 0  Injury with Fall? 0  Risk for fall due to : No Fall Risks   Last Audit-C alcohol use screening    04/08/2023    2:14 PM  Alcohol Use Disorder Test (AUDIT)  1. How often do you have a drink containing alcohol? 3  2. How many drinks containing alcohol do you have on a typical day when you are drinking? 0  3. How often do you have six or more drinks on one occasion? 0  AUDIT-C Score 3  4. How often during the last year have you found that you were not able to stop drinking once you had started? 0  5. How often during the last year have you failed to do what was normally expected from you because of drinking? 0  6. How often during the last year have you needed a first drink in the morning to get yourself going after a heavy drinking session? 0  7. How  often during the last year have you had a feeling of guilt of remorse after drinking? 0  8. How often during the last year have you been unable to remember what happened the night before because you had been drinking? 0  9. Have you or someone else been injured as a result of your drinking? 0  10. Has a relative or friend or a doctor or another health worker been concerned about your drinking or suggested you cut down? 0  Alcohol Use Disorder Identification Test Final Score (AUDIT) 3   A score of 3 or more in women, and 4 or more in men indicates increased risk for  alcohol abuse, EXCEPT if all of the points are from question 1   No results found for any visits on 04/08/23.  Assessment & Plan    Routine Health Maintenance and Physical Exam  Exercise Activities and Dietary recommendations  Goals   None     Immunization History  Administered Date(s) Administered   Influenza, Seasonal, Injecte, Preservative Fre 04/08/2023   Influenza,inj,Quad PF,6+ Mos 10/18/2016, 05/20/2017   Pneumococcal Polysaccharide-23 03/18/2005   Td 08/14/2007    Health Maintenance  Topic Date Due   Zoster Vaccines- Shingrix (1 of 2) Never done   DTaP/Tdap/Td (2 - Tdap) 08/13/2017   MAMMOGRAM  03/22/2025   Colonoscopy  09/11/2025   Cervical Cancer Screening (HPV/Pap Cotest)  03/10/2028   INFLUENZA VACCINE  Completed   Hepatitis C Screening  Completed   HIV Screening  Completed   HPV VACCINES  Aged Out   COVID-19 Vaccine  Discontinued    Discussed health benefits of physical activity, and encouraged her to engage in regular exercise appropriate for her age and condition.   Primary hypertension Assessment & Plan: Blood pressure remains a little bit elevated.  However, will continue current meds without changes d/t some side effects. - Continue lisinopril 40 mg daily - Continue amlodipine 5 mg daily   Primary osteoarthritis involving multiple joints Assessment & Plan: Patient will continue with lidocaine patch and diclofenac gel as needed. Continue to monitor.   Flu vaccine need -     Flu vaccine trivalent PF, 6mos and older(Flulaval,Afluria,Fluarix,Fluzone)    Return in about 3 months (around 07/08/2023) for HTN, HLD.     I discussed the assessment and treatment plan with the patient  The patient was provided an opportunity to ask questions and all were answered. The patient agreed with the plan and demonstrated an understanding of the instructions.   The patient was advised to call back or seek an in-person evaluation if the symptoms worsen or if  the condition fails to improve as anticipated.    Sherlyn Hay, DO  [facility] [phone] (phone) [phone] (fax)  [facility]

## 2023-04-08 NOTE — Assessment & Plan Note (Addendum)
Blood pressure remains a little bit elevated.  However, will continue current meds without changes d/t some side effects. - Continue lisinopril 40 mg daily - Continue amlodipine 5 mg daily

## 2023-04-08 NOTE — Patient Instructions (Addendum)
Check on insurance coverage Shingrix (shingles vaccine) and Tdap (tetanus, diphtheria, and pertussis vaccines).  Do ankle exercises at least twice daily to increase natural ankle support.  You can also try capsaicin cream for your joints.  Increase intake of fresh/frozen fruits and vegetables, whole grains, and, in moderation, fish, poultry and, to a lesser extent, red meat.    Encouraged patient to use smaller plates and glasses to allow for increased satiety with smaller intake of food.   Encouraged patient to start increasing activity levels little by little.   Advised patient to continue not eating if not hungry but rather to follow the body's hunger cues.  Also advised patient to only eat until satisfied, not until full.

## 2023-04-12 ENCOUNTER — Other Ambulatory Visit: Payer: Self-pay | Admitting: Family Medicine

## 2023-04-12 DIAGNOSIS — J449 Chronic obstructive pulmonary disease, unspecified: Secondary | ICD-10-CM

## 2023-04-20 ENCOUNTER — Ambulatory Visit: Payer: Medicaid Other

## 2023-04-20 DIAGNOSIS — M159 Polyosteoarthritis, unspecified: Secondary | ICD-10-CM | POA: Insufficient documentation

## 2023-04-20 NOTE — Assessment & Plan Note (Signed)
Patient will continue with lidocaine patch and diclofenac gel as needed. Continue to monitor.

## 2023-04-26 DIAGNOSIS — Z419 Encounter for procedure for purposes other than remedying health state, unspecified: Secondary | ICD-10-CM | POA: Diagnosis not present

## 2023-05-27 DIAGNOSIS — Z419 Encounter for procedure for purposes other than remedying health state, unspecified: Secondary | ICD-10-CM | POA: Diagnosis not present

## 2023-06-26 DIAGNOSIS — Z419 Encounter for procedure for purposes other than remedying health state, unspecified: Secondary | ICD-10-CM | POA: Diagnosis not present

## 2023-07-01 DIAGNOSIS — F3113 Bipolar disorder, current episode manic without psychotic features, severe: Secondary | ICD-10-CM | POA: Diagnosis not present

## 2023-07-18 ENCOUNTER — Ambulatory Visit: Payer: Medicaid Other | Admitting: Family Medicine

## 2023-07-27 DIAGNOSIS — Z419 Encounter for procedure for purposes other than remedying health state, unspecified: Secondary | ICD-10-CM | POA: Diagnosis not present

## 2023-08-05 ENCOUNTER — Other Ambulatory Visit: Payer: Self-pay | Admitting: Family Medicine

## 2023-08-05 DIAGNOSIS — I10 Essential (primary) hypertension: Secondary | ICD-10-CM

## 2023-08-15 ENCOUNTER — Telehealth: Payer: Self-pay | Admitting: Family Medicine

## 2023-08-15 ENCOUNTER — Ambulatory Visit: Payer: Medicaid Other | Admitting: Family Medicine

## 2023-08-15 ENCOUNTER — Encounter: Payer: Self-pay | Admitting: Family Medicine

## 2023-08-15 VITALS — BP 124/77 | HR 89 | Temp 98.0°F | Ht 63.0 in | Wt 161.7 lb

## 2023-08-15 DIAGNOSIS — E782 Mixed hyperlipidemia: Secondary | ICD-10-CM | POA: Diagnosis not present

## 2023-08-15 DIAGNOSIS — Z5320 Procedure and treatment not carried out because of patient's decision for unspecified reasons: Secondary | ICD-10-CM | POA: Insufficient documentation

## 2023-08-15 DIAGNOSIS — F319 Bipolar disorder, unspecified: Secondary | ICD-10-CM

## 2023-08-15 DIAGNOSIS — Z716 Tobacco abuse counseling: Secondary | ICD-10-CM

## 2023-08-15 DIAGNOSIS — H1013 Acute atopic conjunctivitis, bilateral: Secondary | ICD-10-CM

## 2023-08-15 DIAGNOSIS — K219 Gastro-esophageal reflux disease without esophagitis: Secondary | ICD-10-CM | POA: Diagnosis not present

## 2023-08-15 DIAGNOSIS — F419 Anxiety disorder, unspecified: Secondary | ICD-10-CM

## 2023-08-15 DIAGNOSIS — Z23 Encounter for immunization: Secondary | ICD-10-CM | POA: Diagnosis not present

## 2023-08-15 DIAGNOSIS — Z09 Encounter for follow-up examination after completed treatment for conditions other than malignant neoplasm: Secondary | ICD-10-CM | POA: Diagnosis not present

## 2023-08-15 DIAGNOSIS — F32A Depression, unspecified: Secondary | ICD-10-CM

## 2023-08-15 DIAGNOSIS — I10 Essential (primary) hypertension: Secondary | ICD-10-CM

## 2023-08-15 DIAGNOSIS — J449 Chronic obstructive pulmonary disease, unspecified: Secondary | ICD-10-CM | POA: Diagnosis not present

## 2023-08-15 DIAGNOSIS — F17200 Nicotine dependence, unspecified, uncomplicated: Secondary | ICD-10-CM

## 2023-08-15 MED ORDER — PANTOPRAZOLE SODIUM 40 MG PO TBEC: 40.0000 mg | DELAYED_RELEASE_TABLET | Freq: Every day | ORAL | 3 refills | Status: DC

## 2023-08-15 MED ORDER — AZELASTINE HCL 0.05 % OP SOLN
1.0000 [drp] | Freq: Two times a day (BID) | OPHTHALMIC | 3 refills | Status: AC | PRN
Start: 2023-08-15 — End: ?

## 2023-08-15 NOTE — Patient Instructions (Signed)
Look into ezetimibe as possibility for your cholesterol.

## 2023-08-15 NOTE — Assessment & Plan Note (Signed)
Uses azelastine ophthalmic drops with good results.  Will refill today.

## 2023-08-15 NOTE — Assessment & Plan Note (Signed)
Experiencing frequent acid reflux, currently using Rolaids. No current medication for reflux. Discussed preventative medication and potential pneumonia risk with long-term use. - Prescribe Protonix, take 30 minutes before breakfast or dinner - Advise to avoid greasy foods and eat smaller meals

## 2023-08-15 NOTE — Assessment & Plan Note (Signed)
Patient is doing well on bupropion and currently smokes 7 to 10 cigarettes/day.  Continue bupropion as prescribed.

## 2023-08-15 NOTE — Assessment & Plan Note (Addendum)
Well-controlled today. Deferring blood work to next visit per patient preference. Continue lisinopril 40 mg and amlodipine 5 mg daily.

## 2023-08-15 NOTE — Assessment & Plan Note (Signed)
Uses Symbicort daily and albuterol as needed. No recent exacerbations. - Continue Symbicort and albuterol as needed

## 2023-08-15 NOTE — Assessment & Plan Note (Signed)
High anxiety, especially during holidays and work stress. Recently resumed trazodone. Emphasized importance of managing anxiety and behavioral health follow-up. - Continue trazodone as prescribed - Follow up with behavioral health at [facility]

## 2023-08-15 NOTE — Assessment & Plan Note (Signed)
Reduced smoking to less than ten cigarettes per day with Wellbutrin. Discussed benefits of further reduction and lung cancer screening. - Refer for low-dose CT scan for lung cancer screening

## 2023-08-15 NOTE — Progress Notes (Unsigned)
Established patient visit   Patient: Valerie Goodman   DOB: [birth year]   63 y.o. Female  MRN: [id number] Visit Date: 08/15/2023  Today's healthcare provider: Sherlyn Hay, DO   Chief Complaint  Patient presents with   Medical Management of Chronic Issues   Subjective    HPI The patient, with a history of Hepatitis C (treated and confirmed negative), presents with complaints of acid reflux. She reports consuming Rolaids frequently to manage the discomfort, but is not currently on any prescribed medication for this issue. The patient has been attempting dietary modifications, such as reducing greasy food intake and eating smaller meals, to alleviate symptoms.  The patient also reports a history of smoking, having quit for ten years before resuming in 2014. She has made efforts to reduce her smoking habit, currently smoking less than ten cigarettes a day, down from a pack a day. She attributes some of this reduction to the effects of Wellbutrin.   - 1 ppd for >10 years; then quit for ten years, then restarted in 2014 approx. 0.75-1 ppd until last year; now at 0.5-0.75 ppd.  The patient uses Symbicort daily and Albuterol as needed, typically in response to dry air or exposure to cats, to which she is allergic.  The patient has been experiencing increased anxiety due to the holiday season, changes at work, and the challenges of caring for her elderly mother who has Asperger's. As a result, she has been prescribed Trazodone. She also takes Amlodipine and Valacyclovir daily for prophylaxis.  The patient has a history of high cholesterol but has chosen not to take medication for it due to concerns about side effects. Instead, she has been trying to manage her cholesterol levels through dietary changes, including the consumption of three [place] nuts daily and fiber supplements. She previously took [employer] but stopped due to perceived aggravation of her heartburn symptoms.   {History  (Optional):23778}  Medications: Outpatient Medications Prior to Visit  Medication Sig   albuterol (PROVENTIL HFA;VENTOLIN HFA) 108 (90 BASE) MCG/ACT inhaler Inhale 2 puffs into the lungs every 6 (six) hours as needed for wheezing or shortness of breath.   amLODipine (NORVASC) 5 MG tablet Take 1 tablet by mouth once daily   buPROPion (WELLBUTRIN XL) 300 MG 24 hr tablet Take 300 mg by mouth daily.   diclofenac Sodium (VOLTAREN) 1 % GEL Apply 2 g topically 4 (four) times daily.   lidocaine 4 % Place 1 patch onto the skin daily.   lisinopril (ZESTRIL) 40 MG tablet Take 1 tablet (40 mg total) by mouth daily.   QUEtiapine Fumarate 150 MG TABS Take 1 tablet by mouth at bedtime.   SYMBICORT 160-4.5 MCG/ACT inhaler INHALE 2 PUFFS TWICE DAILY   traZODone (DESYREL) 50 MG tablet Take 50 mg by mouth at bedtime as needed.   valACYclovir (VALTREX) 500 MG tablet Take 1 tablet (500 mg total) by mouth daily.   [DISCONTINUED] RED YEAST RICE EXTRACT PO Take 500 mg by mouth daily.   No facility-administered medications prior to visit.    Review of Systems  Constitutional:  Negative for appetite change, chills, fatigue and fever.  Respiratory:  Negative for chest tightness and shortness of breath.   Cardiovascular:  Negative for chest pain and palpitations.  Gastrointestinal:  Negative for abdominal pain, nausea and vomiting.  Neurological:  Negative for dizziness, weakness and headaches.    {Insert previous labs (optional):23779} {See past labs  Heme  Chem  Endocrine  Serology  Results Review (optional):1}   Objective    BP 124/77 (BP Location: Right Arm, Patient Position: Sitting, Cuff Size: Normal)   Pulse 89   Temp 98 F (36.7 C) (Oral)   Ht 5\' 3"  (1.6 m)   Wt 161 lb 11.2 oz (73.3 kg)   LMP  (LMP Unknown)   SpO2 97%   BMI 28.64 kg/m  {Insert last BP/Wt (optional):23777}{See vitals history (optional):1}   Physical Exam Vitals and nursing note reviewed.  Constitutional:      General:  She is not in acute distress.    Appearance: Normal appearance.  HENT:     Head: Normocephalic and atraumatic.  Eyes:     General: No scleral icterus.    Conjunctiva/sclera: Conjunctivae normal.  Cardiovascular:     Rate and Rhythm: Normal rate.  Pulmonary:     Effort: Pulmonary effort is normal.  Neurological:     Mental Status: She is alert and oriented to person, place, and time. Mental status is at baseline.  Psychiatric:        Mood and Affect: Mood normal.        Behavior: Behavior normal.      No results found for any visits on 08/15/23.  Assessment & Plan    Allergic conjunctivitis of both eyes Assessment & Plan: Uses azelastine ophthalmic drops with good results.  Will refill today.  Orders: -     Azelastine HCl; Place 1 drop into both eyes 2 (two) times daily as needed.  Dispense: 6 mL; Refill: 3  Follow-up exam, 3-6 months since previous exam  Primary hypertension Assessment & Plan: Well-controlled today. Deferring blood work to next visit per patient preference. Continue lisinopril 40 mg and amlodipine 5 mg daily.   Chronic obstructive pulmonary disease, unspecified COPD type (HCC) Assessment & Plan: Uses Symbicort daily and albuterol as needed. No recent exacerbations. - Continue Symbicort and albuterol as needed   Gastroesophageal reflux disease, unspecified whether esophagitis present Assessment & Plan: Experiencing frequent acid reflux, currently using Rolaids. No current medication for reflux. Discussed preventative medication and potential pneumonia risk with long-term use. - Prescribe Protonix, take 30 minutes before breakfast or dinner - Advise to avoid greasy foods and eat smaller meals  Orders: -     Pantoprazole Sodium; Take 1 tablet (40 mg total) by mouth daily before breakfast.  Dispense: 30 tablet; Refill: 3  Nicotine dependence with current use Assessment & Plan: Patient is doing well on bupropion and currently smokes 7 to 10  cigarettes/day.  Continue bupropion as prescribed.    Orders: -     Ambulatory Referral for Lung Cancer Scre  Encounter for smoking cessation counseling Assessment & Plan: Reduced smoking to less than ten cigarettes per day with Wellbutrin. Discussed benefits of further reduction and lung cancer screening. - Refer for low-dose CT scan for lung cancer screening   Mixed hyperlipidemia Assessment & Plan: Not on cholesterol medication due to concerns about side effects. Managing with dietary changes. Discussed 11% ten-year risk of heart attack or stroke and non-statin options. - Include information on non-statin cholesterol medication in after-visit summary   Statin declined Assessment & Plan: Patient declines statin due to concern of side effects.  Discussed possibility of ezetimibe and encouraged patient to look into it.  Current ASCVD risk score 11%.   Bipolar 1 disorder (HCC) Assessment & Plan: High anxiety, especially during holidays and work stress. Recently resumed trazodone. Emphasized importance of managing anxiety and behavioral health follow-up. - Continue quetiapine, bupropion and  trazodone as prescribed - [facility] up with [facility] at [facility]   Anxiety and depression Assessment & Plan: High anxiety, especially during holidays and work stress. Recently resumed trazodone. Emphasized importance of managing anxiety and behavioral health follow-up. - Continue trazodone as prescribed - Follow up with behavioral health at [facility]   Encounter for Prevnar pneumococcal vaccination -     Pneumococcal conjugate vaccine 20-valent  Need for shingles vaccine -     Varicella-zoster vaccine IM  General Health Maintenance Due for shingles and pneumonia vaccines. Discussed importance. - Administer shingles and pneumonia vaccines  Return in about 3 months (around 11/13/2023).      I discussed the assessment and treatment plan with the patient  The patient was provided an  opportunity to ask questions and all were answered. The patient agreed with the plan and demonstrated an understanding of the instructions.   The patient was advised to call back or seek an in-person evaluation if the symptoms worsen or if the condition fails to improve as anticipated.    Sherlyn Hay, DO  [facility] [phone] (phone) [phone] (fax)  [facility]

## 2023-08-15 NOTE — Telephone Encounter (Signed)
Covermymeds is requesting prior authorization Key: [id number] Azelastine HCI 0.05 % Solution

## 2023-08-15 NOTE — Assessment & Plan Note (Signed)
High anxiety, especially during holidays and work stress. Recently resumed trazodone. Emphasized importance of managing anxiety and behavioral health follow-up. - Continue quetiapine, bupropion and trazodone as prescribed - Follow up with behavioral health at [facility]

## 2023-08-15 NOTE — Assessment & Plan Note (Signed)
Not on cholesterol medication due to concerns about side effects. Managing with dietary changes. Discussed 11% ten-year risk of heart attack or stroke and non-statin options. - Include information on non-statin cholesterol medication in after-visit summary

## 2023-08-15 NOTE — Assessment & Plan Note (Signed)
Patient declines statin due to concern of side effects.  Discussed possibility of ezetimibe and encouraged patient to look into it.  Current ASCVD risk score 11%.

## 2023-08-17 ENCOUNTER — Telehealth: Payer: Self-pay

## 2023-08-17 ENCOUNTER — Other Ambulatory Visit: Payer: Self-pay

## 2023-08-17 DIAGNOSIS — Z87891 Personal history of nicotine dependence: Secondary | ICD-10-CM

## 2023-08-17 DIAGNOSIS — F17210 Nicotine dependence, cigarettes, uncomplicated: Secondary | ICD-10-CM

## 2023-08-17 DIAGNOSIS — Z122 Encounter for screening for malignant neoplasm of respiratory organs: Secondary | ICD-10-CM

## 2023-08-17 NOTE — Telephone Encounter (Signed)
.  Lung Cancer Screening Narrative/Criteria Questionnaire (Cigarette Smokers Only- No Cigars/Pipes/vapes)   Burman Riis   SDMV:08/24/2023 @ 11:30am    Alphonsa Gin, RN   Apr 16, 1961   LDCT: 08/29/2023 @ 1:40pm at [facility]    63 y.o.   Phone: [phone]  Lung Screening Narrative (confirm age 60-77 yrs Medicare / 50-80 yrs Private pay insurance)   Insurance information: Medicaid   Referring Provider:Pardue   This screening involves an initial phone call with a team member from our program. It is called a shared decision making visit. The initial meeting is required by  insurance and Medicare to make sure you understand the program. This appointment takes about 15-20 minutes to complete. You will complete the screening scan at your scheduled date/time.  This scan takes about 5-10 minutes to complete. You can eat and drink normally before and after the scan.  Criteria questions for Lung Cancer Screening:   Are you a current or former smoker? Current Age began smoking: 24   If you are a former smoker, what year did you quit smoking? Quit 11 (within 15 yrs)   To calculate your smoking history, I need an accurate estimate of how many packs of cigarettes you smoked per day and for how many years. (Not just the number of PPD you are now smoking)   Years smoking 27 x Packs per day 1 = Pack years 27   (at least 20 pack yrs)   (Make sure they understand that we need to know how much they have smoked in the past, not just the number of PPD they are smoking now)  Do you have a personal history of cancer?  Yes - (type and when diagnosed - 5 yrs cancer free) Skin    Do you have a family history of cancer? Yes  (cancer type and and relative) Father  Lung , Maternal Grandfather Lung, Mom Breast  Are you coughing up blood?  No  Have you had unexplained weight loss of 15 lbs or more in the last 6 months? No  It looks like you meet all criteria.  When would be a good time for [place] to schedule you for  this screening?   Additional information:

## 2023-08-17 NOTE — Telephone Encounter (Signed)
PA initiated

## 2023-08-19 DIAGNOSIS — H1013 Acute atopic conjunctivitis, bilateral: Secondary | ICD-10-CM

## 2023-08-22 MED ORDER — CROMOLYN SODIUM 4 % OP SOLN
1.0000 [drp] | Freq: Four times a day (QID) | OPHTHALMIC | 12 refills | Status: DC
Start: 2023-08-22 — End: 2024-03-20

## 2023-08-27 DIAGNOSIS — Z419 Encounter for procedure for purposes other than remedying health state, unspecified: Secondary | ICD-10-CM | POA: Diagnosis not present

## 2023-08-29 ENCOUNTER — Ambulatory Visit: Payer: Medicaid Other

## 2023-09-07 ENCOUNTER — Ambulatory Visit (INDEPENDENT_AMBULATORY_CARE_PROVIDER_SITE_OTHER): Payer: Medicaid Other | Admitting: Acute Care

## 2023-09-07 DIAGNOSIS — F17210 Nicotine dependence, cigarettes, uncomplicated: Secondary | ICD-10-CM | POA: Diagnosis not present

## 2023-09-07 NOTE — Progress Notes (Addendum)
 Provider Attestation I agree with the documentation of the Shared Decision Making visit,  smoking cessation counseling if appropriate, and verification or eligibility for lung cancer screening as documented by the RN Nurse Navigator.   Raejean Bullock, MSN, AGACNP-BC [facility] See Amion for personal pager PCCM on call pager [phone]    Virtual Visit via Telephone Note  I connected with Valerie Goodman on 09/07/23 at 12:30 PM EST by telephone and verified that I am speaking with the correct person using two identifiers.  Location: Patient: Valerie Goodman Provider: Alyse Bach, RN   I discussed the limitations, risks, security and privacy concerns of performing an evaluation and management service by telephone and the availability of in person appointments. I also discussed with the patient that there may be a patient responsible charge related to this service. The patient expressed understanding and agreed to proceed.    Shared Decision Making Visit Lung Cancer Screening Program [phone])   Eligibility: Age 63 y.o. Pack Years Smoking History Calculation 28 (# packs/per year x # years smoked) Recent History of coughing up blood  no Unexplained weight loss? no ( >Than 15 pounds within the last 6 months ) Prior History Lung / other cancer no (Diagnosis within the last 5 years already requiring surveillance chest CT Scans). Smoking Status Current Smoker Former Smokers: Years since quit: n/a  Quit Date: n/a  Visit Components: Discussion included one or more decision making aids. yes Discussion included risk/benefits of screening. yes Discussion included potential follow up diagnostic testing for abnormal scans. yes Discussion included meaning and risk of over diagnosis. yes Discussion included meaning and risk of False Positives. yes Discussion included meaning of total radiation exposure. yes  Counseling Included: Importance of adherence to  annual lung cancer LDCT screening. yes Impact of comorbidities on ability to participate in the program. yes Ability and willingness to under diagnostic treatment. yes  Smoking Cessation Counseling: Current Smokers:  Discussed importance of smoking cessation. yes Information about tobacco cessation classes and interventions provided to patient. yes Patient provided with "ticket" for LDCT Scan. no Symptomatic Patient. no  Counseling(Intermediate counseling: > three minutes) 99406 Diagnosis Code: Tobacco Use Z72.0 Asymptomatic Patient yes  Counseling (Intermediate counseling: > three minutes counseling) N0272 Former Smokers:  Discussed the importance of maintaining cigarette abstinence. yes Diagnosis Code: Personal History of Nicotine Dependence. Z36.644 Information about tobacco cessation classes and interventions provided to patient. Yes Patient provided with "ticket" for LDCT Scan. no Written Order for Lung Cancer Screening with LDCT placed in Epic. Yes (CT Chest Lung Cancer Screening Low Dose W/O CM) [id number] Z12.2-Screening of respiratory organs Z87.891-Personal history of nicotine dependence   Alyse Bach, RN

## 2023-09-07 NOTE — Patient Instructions (Signed)

## 2023-09-08 ENCOUNTER — Ambulatory Visit
Admission: RE | Admit: 2023-09-08 | Discharge: 2023-09-08 | Disposition: A | Discharge status: Home / Self Care | Payer: Medicaid Other | Source: Ambulatory Visit | Attending: Acute Care | Admitting: Acute Care

## 2023-09-08 DIAGNOSIS — F17210 Nicotine dependence, cigarettes, uncomplicated: Secondary | ICD-10-CM | POA: Insufficient documentation

## 2023-09-08 DIAGNOSIS — Z122 Encounter for screening for malignant neoplasm of respiratory organs: Secondary | ICD-10-CM | POA: Insufficient documentation

## 2023-09-08 DIAGNOSIS — Z85820 Personal history of malignant melanoma of skin: Secondary | ICD-10-CM | POA: Insufficient documentation

## 2023-09-08 DIAGNOSIS — I2510 Atherosclerotic heart disease of native coronary artery without angina pectoris: Secondary | ICD-10-CM | POA: Insufficient documentation

## 2023-09-08 DIAGNOSIS — R918 Other nonspecific abnormal finding of lung field: Secondary | ICD-10-CM | POA: Insufficient documentation

## 2023-09-08 DIAGNOSIS — J439 Emphysema, unspecified: Secondary | ICD-10-CM | POA: Insufficient documentation

## 2023-09-08 DIAGNOSIS — K449 Diaphragmatic hernia without obstruction or gangrene: Secondary | ICD-10-CM | POA: Insufficient documentation

## 2023-09-08 DIAGNOSIS — Z87891 Personal history of nicotine dependence: Secondary | ICD-10-CM | POA: Diagnosis present

## 2023-09-08 DIAGNOSIS — I700 Atherosclerosis of aorta: Secondary | ICD-10-CM | POA: Diagnosis not present

## 2023-09-20 ENCOUNTER — Other Ambulatory Visit: Payer: Self-pay

## 2023-09-20 ENCOUNTER — Telehealth: Payer: Self-pay | Admitting: Acute Care

## 2023-09-20 DIAGNOSIS — R911 Solitary pulmonary nodule: Secondary | ICD-10-CM

## 2023-09-20 DIAGNOSIS — Z87891 Personal history of nicotine dependence: Secondary | ICD-10-CM

## 2023-09-20 NOTE — Telephone Encounter (Signed)
LVM to review results.

## 2023-09-20 NOTE — Telephone Encounter (Signed)
 Spoke with patient and reviewed CT results. Small 6 mm nodule seen. Pt reports having LDCT at [facility] 3 years ago. Care everywhere showed 5mm nodule at that time. Advised pt we will repeat CT in 6 months . Pt verbalized understanding. Results/ plans faxed to PCP. Order placed for 6 mth nodule f/u CT.

## 2023-09-20 NOTE — Telephone Encounter (Signed)
 Call report

## 2023-09-20 NOTE — Telephone Encounter (Signed)
 Call Report  IMPRESSION: 1. Lung-RADS 3, probably benign findings. Short-term follow-up in 6 months is recommended with repeat low-dose chest CT without contrast (please use the following order, "CT CHEST LCS NODULE FOLLOW-UP W/O CM"). Two scattered solid pulmonary nodules, largest 6.0 mm in the anterior left upper lobe. 2. One vessel coronary atherosclerosis. 3. Small hiatal hernia. 4. Aortic Atherosclerosis (ICD10-I70.0) and Emphysema (ICD10-J43.9).

## 2023-09-24 DIAGNOSIS — Z419 Encounter for procedure for purposes other than remedying health state, unspecified: Secondary | ICD-10-CM | POA: Diagnosis not present

## 2023-09-29 DIAGNOSIS — F3132 Bipolar disorder, current episode depressed, moderate: Secondary | ICD-10-CM | POA: Diagnosis not present

## 2023-10-13 DIAGNOSIS — F411 Generalized anxiety disorder: Secondary | ICD-10-CM | POA: Diagnosis not present

## 2023-10-18 DIAGNOSIS — F331 Major depressive disorder, recurrent, moderate: Secondary | ICD-10-CM | POA: Diagnosis not present

## 2023-11-05 DIAGNOSIS — Z419 Encounter for procedure for purposes other than remedying health state, unspecified: Secondary | ICD-10-CM | POA: Diagnosis not present

## 2023-11-14 ENCOUNTER — Ambulatory Visit (INDEPENDENT_AMBULATORY_CARE_PROVIDER_SITE_OTHER): Payer: Self-pay | Admitting: Family Medicine

## 2023-11-14 ENCOUNTER — Telehealth: Payer: Self-pay | Admitting: Family Medicine

## 2023-11-14 ENCOUNTER — Encounter: Payer: Self-pay | Admitting: Family Medicine

## 2023-11-14 VITALS — BP 129/79 | HR 96 | Resp 16 | Ht 63.0 in | Wt 161.9 lb

## 2023-11-14 DIAGNOSIS — K219 Gastro-esophageal reflux disease without esophagitis: Secondary | ICD-10-CM

## 2023-11-14 DIAGNOSIS — Z09 Encounter for follow-up examination after completed treatment for conditions other than malignant neoplasm: Secondary | ICD-10-CM

## 2023-11-14 DIAGNOSIS — F17200 Nicotine dependence, unspecified, uncomplicated: Secondary | ICD-10-CM | POA: Diagnosis not present

## 2023-11-14 DIAGNOSIS — M810 Age-related osteoporosis without current pathological fracture: Secondary | ICD-10-CM | POA: Diagnosis not present

## 2023-11-14 DIAGNOSIS — Z23 Encounter for immunization: Secondary | ICD-10-CM

## 2023-11-14 MED ORDER — PANTOPRAZOLE SODIUM 40 MG PO TBEC
40.0000 mg | DELAYED_RELEASE_TABLET | Freq: Every day | ORAL | 3 refills | Status: DC
Start: 2023-11-14 — End: 2024-03-20

## 2023-11-14 NOTE — Progress Notes (Signed)
 Established patient visit   Patient: Valerie Goodman   DOB: [birth year]   63 y.o. Female  MRN: [id number] Visit Date: 11/14/2023  Today's healthcare provider: Carlean Charter, DO   Chief Complaint  Patient presents with   Follow-up    3 MONTH F/U no other concerns   Subjective    HPI Valerie Goodman is a 63 year old female who presents for a routine follow-up visit.  She is due for a follow-up lung cancer screening in August, which has been ordered but not yet scheduled.  She manages her cholesterol with [place] nuts and fiber supplements but has stopped taking red yeast rice due to indigestion. She is not on any cholesterol medication per her preference.  Her reflux symptoms are manageable with medication taken in the morning but worsen with spicy foods, margaritas, and tomato-based products. A CT scan revealed a small hiatal hernia.  She smokes less than half a pack a day, often not finishing a cigarette (usually a 1/2). A recent decrease in smoking is attributed to having 'the crud' three weeks ago and losing the taste for cigarettes. She has previously used nicotine patches to quit smoking but experienced vivid dreams.  She uses Symbicort  daily, azelastine  eye drops, and cromolyn  sporadically. She takes valacyclovir  without issues. She uses albuterol more during allergy season and takes Allegra or Sudafed intermittently for allergies.  She has a history of osteoporosis with a previous DEXA scan in 2017 showing a T-score of -2.6 in the spine. She has experienced fractures in her feet and sternum, which seemed abnormal given the circumstances. She has tried Fosamax  and Boniva  but experienced gastrointestinal issues.  She notes a gap between her front teeth and has chipped teeth requiring caps, but has not seen a dentist recently.     Medications: Outpatient Medications Prior to Visit  Medication Sig   albuterol (PROVENTIL HFA;VENTOLIN HFA) 108 (90 BASE) MCG/ACT inhaler Inhale  2 puffs into the lungs every 6 (six) hours as needed for wheezing or shortness of breath.   amLODipine  (NORVASC ) 5 MG tablet Take 1 tablet by mouth once daily   azelastine  (OPTIVAR ) 0.05 % ophthalmic solution Place 1 drop into both eyes 2 (two) times daily as needed.   buPROPion (WELLBUTRIN XL) 300 MG 24 hr tablet Take 300 mg by mouth daily.   cromolyn  (OPTICROM ) 4 % ophthalmic solution Place 1-2 drops into both eyes 4 (four) times daily.   diclofenac  Sodium (VOLTAREN ) 1 % GEL Apply 2 g topically 4 (four) times daily.   lisinopril  (ZESTRIL ) 40 MG tablet Take 1 tablet (40 mg total) by mouth daily.   pantoprazole  (PROTONIX ) 40 MG tablet Take 1 tablet (40 mg total) by mouth daily before breakfast.   QUEtiapine Fumarate 150 MG TABS Take 1 tablet by mouth at bedtime.   SYMBICORT  160-4.5 MCG/ACT inhaler INHALE 2 PUFFS TWICE DAILY   traZODone (DESYREL) 50 MG tablet Take 50 mg by mouth at bedtime as needed.   valACYclovir  (VALTREX ) 500 MG tablet Take 1 tablet (500 mg total) by mouth daily.   [DISCONTINUED] lidocaine  4 % Place 1 patch onto the skin daily.   No facility-administered medications prior to visit.        Objective    BP 129/79 (BP Location: Right Arm, Patient Position: Bed low/side rails up)   Pulse 96   Resp 16   Ht 5\' 3"  (1.6 m)   Wt 161 lb 14.4 oz (73.4 kg)   LMP  (LMP Unknown)  SpO2 97%   BMI 28.68 kg/m     Physical Exam Vitals and nursing note reviewed.  Constitutional:      General: She is not in acute distress.    Appearance: Normal appearance.  HENT:     Head: Normocephalic and atraumatic.  Eyes:     General: No scleral icterus.    Conjunctiva/sclera: Conjunctivae normal.  Cardiovascular:     Rate and Rhythm: Normal rate.  Pulmonary:     Effort: Pulmonary effort is normal.  Neurological:     Mental Status: She is alert and oriented to person, place, and time. Mental status is at baseline.  Psychiatric:        Mood and Affect: Mood normal.         Behavior: Behavior normal.      No results found for any visits on 11/14/23.  Assessment & Plan    Follow-up exam, 3-6 months since previous exam  Need for shingles vaccine -     Varicella-zoster vaccine IM  Osteoporosis, unspecified osteoporosis type, unspecified pathological fracture presence -     DG Bone Density; Future  Nicotine dependence with current use     Follow-up exam, 3-6 months since previous exam Routine wellness visit with no acute concerns. Ongoing management of chronic conditions and preventive care discussed. - Administer follow-up shingles vaccine. - Plan for blood work at annual physical in July. - Schedule bone density scan.  Osteoporosis Osteoporosis with previous fractures (sternum x2 and foot). Intolerance to oral bisphosphonates. Interested in injection options. - Schedule bone density scan to assess current bone health. - Consider referral for injection therapy if significant changes are noted in bone density.  Nicotine dependence, cigarettes Currently smoking less than half a pack per day. Discussed smoking cessation strategies, including nicotine patches and behavioral techniques. - Continue to reduce smoking independently, per patient preference. - Consider using 7 mg nicotine patch, nicotine gum or nicotine lozenges if needed.  Gastroesophageal reflux disease with hiatal hernia GERD with small hiatal hernia. Symptoms well-managed with medication and dietary modifications. Continue conservative measures. - Continue current medication regimen for GERD. - Avoid trigger foods and beverages.  Allergic rhinitis Seasonal allergic rhinitis managed by antihistamines and decongestants. Prefers Allegra and occasionally uses Sudafed. - Take Allegra consistently during allergy season. - Use Sudafed only as needed for acute symptoms.  Herpes simplex prophylaxis On valacyclovir  for herpes simplex prophylaxis with no recent outbreaks. Plan to discontinue  after one year. - Discontinue valacyclovir  after one year; send emergency breakout supply at that time - Monitor for herpes simplex outbreaks and restart daily preventative medication if needed.   Return in about 3 months (around 02/15/2024) for CPE.      I discussed the assessment and treatment plan with the patient  The patient was provided an opportunity to ask questions and all were answered. The patient agreed with the plan and demonstrated an understanding of the instructions.   The patient was advised to call back or seek an in-person evaluation if the symptoms worsen or if the condition fails to improve as anticipated.    Carlean Charter, DO  [facility] [phone] (phone) [phone] (fax)  [facility]

## 2023-11-14 NOTE — Telephone Encounter (Signed)
[facility] faxed refill request for the following medications:  pantoprazole (PROTONIX) 40 MG tablet   Please advise.  

## 2023-12-05 DIAGNOSIS — Z419 Encounter for procedure for purposes other than remedying health state, unspecified: Secondary | ICD-10-CM | POA: Diagnosis not present

## 2023-12-06 ENCOUNTER — Encounter: Payer: Self-pay | Admitting: Family Medicine

## 2023-12-22 DIAGNOSIS — F411 Generalized anxiety disorder: Secondary | ICD-10-CM | POA: Diagnosis not present

## 2023-12-26 ENCOUNTER — Other Ambulatory Visit: Payer: Self-pay | Admitting: Family Medicine

## 2023-12-26 ENCOUNTER — Encounter: Payer: Self-pay | Admitting: Acute Care

## 2023-12-26 DIAGNOSIS — I10 Essential (primary) hypertension: Secondary | ICD-10-CM

## 2023-12-27 DIAGNOSIS — F331 Major depressive disorder, recurrent, moderate: Secondary | ICD-10-CM | POA: Diagnosis not present

## 2024-01-02 ENCOUNTER — Other Ambulatory Visit: Payer: Self-pay

## 2024-01-02 ENCOUNTER — Encounter: Payer: Self-pay | Admitting: Family Medicine

## 2024-01-02 DIAGNOSIS — M810 Age-related osteoporosis without current pathological fracture: Secondary | ICD-10-CM

## 2024-01-03 NOTE — Telephone Encounter (Signed)
 Please see the message below and advise, after reviewing the chart an order hasn't been placed

## 2024-01-05 DIAGNOSIS — Z419 Encounter for procedure for purposes other than remedying health state, unspecified: Secondary | ICD-10-CM | POA: Diagnosis not present

## 2024-02-04 DIAGNOSIS — Z419 Encounter for procedure for purposes other than remedying health state, unspecified: Secondary | ICD-10-CM | POA: Diagnosis not present

## 2024-02-14 ENCOUNTER — Encounter: Admitting: Family Medicine

## 2024-02-22 ENCOUNTER — Encounter: Payer: Self-pay | Admitting: Acute Care

## 2024-02-28 ENCOUNTER — Ambulatory Visit
Admission: RE | Admit: 2024-02-28 | Discharge: 2024-02-28 | Disposition: A | Discharge status: Home / Self Care | Source: Ambulatory Visit | Attending: Family Medicine | Admitting: Family Medicine

## 2024-02-28 DIAGNOSIS — M810 Age-related osteoporosis without current pathological fracture: Secondary | ICD-10-CM | POA: Diagnosis not present

## 2024-02-28 DIAGNOSIS — Z780 Asymptomatic menopausal state: Secondary | ICD-10-CM | POA: Diagnosis not present

## 2024-03-06 ENCOUNTER — Ambulatory Visit: Payer: Self-pay | Admitting: Family Medicine

## 2024-03-06 DIAGNOSIS — Z419 Encounter for procedure for purposes other than remedying health state, unspecified: Secondary | ICD-10-CM | POA: Diagnosis not present

## 2024-03-20 ENCOUNTER — Ambulatory Visit (INDEPENDENT_AMBULATORY_CARE_PROVIDER_SITE_OTHER): Admitting: Family Medicine

## 2024-03-20 ENCOUNTER — Encounter: Payer: Self-pay | Admitting: Family Medicine

## 2024-03-20 VITALS — BP 158/96 | HR 90 | Ht 63.0 in | Wt 165.0 lb

## 2024-03-20 DIAGNOSIS — Z6829 Body mass index (BMI) 29.0-29.9, adult: Secondary | ICD-10-CM

## 2024-03-20 DIAGNOSIS — M797 Fibromyalgia: Secondary | ICD-10-CM | POA: Diagnosis not present

## 2024-03-20 DIAGNOSIS — E663 Overweight: Secondary | ICD-10-CM | POA: Diagnosis not present

## 2024-03-20 DIAGNOSIS — I10 Essential (primary) hypertension: Secondary | ICD-10-CM | POA: Diagnosis not present

## 2024-03-20 DIAGNOSIS — K219 Gastro-esophageal reflux disease without esophagitis: Secondary | ICD-10-CM | POA: Diagnosis not present

## 2024-03-20 DIAGNOSIS — R944 Abnormal results of kidney function studies: Secondary | ICD-10-CM

## 2024-03-20 DIAGNOSIS — E782 Mixed hyperlipidemia: Secondary | ICD-10-CM

## 2024-03-20 DIAGNOSIS — D751 Secondary polycythemia: Secondary | ICD-10-CM

## 2024-03-20 DIAGNOSIS — Z0000 Encounter for general adult medical examination without abnormal findings: Secondary | ICD-10-CM

## 2024-03-20 DIAGNOSIS — Z716 Tobacco abuse counseling: Secondary | ICD-10-CM

## 2024-03-20 DIAGNOSIS — M810 Age-related osteoporosis without current pathological fracture: Secondary | ICD-10-CM | POA: Diagnosis not present

## 2024-03-20 DIAGNOSIS — R7309 Other abnormal glucose: Secondary | ICD-10-CM

## 2024-03-20 DIAGNOSIS — J449 Chronic obstructive pulmonary disease, unspecified: Secondary | ICD-10-CM

## 2024-03-20 DIAGNOSIS — Z713 Dietary counseling and surveillance: Secondary | ICD-10-CM

## 2024-03-20 DIAGNOSIS — E559 Vitamin D deficiency, unspecified: Secondary | ICD-10-CM

## 2024-03-20 DIAGNOSIS — F17200 Nicotine dependence, unspecified, uncomplicated: Secondary | ICD-10-CM

## 2024-03-20 DIAGNOSIS — Z79899 Other long term (current) drug therapy: Secondary | ICD-10-CM | POA: Diagnosis not present

## 2024-03-20 MED ORDER — SYMBICORT 160-4.5 MCG/ACT IN AERO: 2.0000 | INHALATION_SPRAY | Freq: Two times a day (BID) | RESPIRATORY_TRACT | 11 refills | Status: AC

## 2024-03-20 MED ORDER — LISINOPRIL 40 MG PO TABS: 40.0000 mg | ORAL_TABLET | Freq: Every day | ORAL | 3 refills | Status: AC

## 2024-03-20 MED ORDER — SEMAGLUTIDE-WEIGHT MANAGEMENT 0.25 MG/0.5ML ~~LOC~~ SOAJ
0.2500 mg | SUBCUTANEOUS | 0 refills | Status: AC
Start: 2024-03-20 — End: 2024-04-17

## 2024-03-20 MED ORDER — PANTOPRAZOLE SODIUM 40 MG PO TBEC
40.0000 mg | DELAYED_RELEASE_TABLET | Freq: Every day | ORAL | 3 refills | Status: AC
Start: 2024-03-20 — End: ?

## 2024-03-20 MED ORDER — ATORVASTATIN CALCIUM 10 MG PO TABS
10.0000 mg | ORAL_TABLET | Freq: Every day | ORAL | 3 refills | Status: AC
Start: 2024-03-20 — End: ?

## 2024-03-20 NOTE — Progress Notes (Unsigned)
 Complete physical exam   Patient: Valerie Goodman   DOB: [birth year]   63 y.o. Female  MRN: [id number] Visit Date: 03/20/2024  Today's healthcare provider: LAURAINE LOISE BUOY, DO   Chief Complaint  Patient presents with   Annual Exam   Care Management    Pattern of eating:General   Are you exercising:No     Vaccine: flu not in clinic yet       Subjective    Valerie Goodman is a 63 y.o. female who presents today for a complete physical exam.  She reports consuming a {diet types:17450} diet. {Exercise:19826} She generally feels {well/fairly well/poorly:18703}. She reports sleeping {well/fairly well/poorly:18703}. She {does/does not:200015} have additional problems to discuss today.   HPI HPI     Care Management    Additional comments: Pattern of eating:General   Are you exercising:No     Vaccine: flu not in clinic yet          Last edited by Thelbert Eulalio HERO, CMA on 03/20/2024  3:17 PM.             REDO MANUAL BP   Taking vitamin D  and calcium ? *** START ALENDRONATE ??  Past Medical History:  Diagnosis Date   Abnormal weight gain    Allergy    Anxiety    Arthritis    Asthma    Bipolar 1 disorder (HCC)    Cancer (HCC)    Cervical pain    Chronic urticaria    Complication of anesthesia    wakes up during procedures, hard to sedate   COPD (chronic obstructive pulmonary disease) (HCC)    Depression    Depression, major, single episode, moderate (HCC) 05/20/2017   HCV (hepatitis C virus)    Hypertension    Low back pain    Menopause    Mood disorder (HCC)    Neuromuscular disorder (HCC)    Osteoporosis    Stress fracture    Past Surgical History:  Procedure Laterality Date   COLONOSCOPY WITH PROPOFOL  N/A 09/12/2015   Procedure: COLONOSCOPY WITH PROPOFOL ;  Surgeon: Rogelia Copping, MD;  Location: [facility];  Service: Endoscopy;  Laterality: N/A;   TUBAL LIGATION     WISDOM TOOTH EXTRACTION     Social History   Socioeconomic History    Marital status: Single    Spouse name: Not on file   Number of children: Not on file   Years of education: Not on file   Highest education level: Some college, no degree  Occupational History   Not on file  Tobacco Use   Smoking status: Every Day    Current packs/day: 0.75    Average packs/day: 0.8 packs/day for 38.6 years (29.0 ttl pk-yrs)    Types: Cigarettes    Start date: 47   Smokeless tobacco: Never  Vaping Use   Vaping status: Never Used  Substance and Sexual Activity   Alcohol use: Yes    Comment: drinks 2 days a week typically; 2 shots liqour and 2 budlight 06/25/21   Drug use: Yes    Types: Marijuana   Sexual activity: Yes  Other Topics Concern   Not on file  Social History Narrative   ** Merged History Encounter **       Social Drivers of Health   Financial Resource Strain: Medium Risk (03/19/2024)   Overall Financial Resource Strain (CARDIA)    Difficulty of Paying Living Expenses: Somewhat hard  Food Insecurity: No Food Insecurity (03/19/2024)   Hunger Vital  Sign    Worried About Programme researcher, broadcasting/film/video in the Last Year: Never true    Ran Out of Food in the Last Year: Never true  Transportation Needs: No Transportation Needs (03/19/2024)   PRAPARE - Administrator, Civil Service (Medical): No    Lack of Transportation (Non-Medical): No  Physical Activity: Insufficiently Active (03/19/2024)   Exercise Vital Sign    Days of Exercise per Week: 3 days    Minutes of Exercise per Session: 20 min  Stress: Stress Concern Present (03/19/2024)   [employer] of Occupational Health - Occupational Stress Questionnaire    Feeling of Stress: To some extent  Social Connections: Socially Isolated (03/19/2024)   Social Connection and Isolation Panel    Frequency of Communication with Friends and Family: Three times a week    Frequency of Social Gatherings with Friends and Family: Patient declined    Attends Religious Services: Never    Database administrator or  Organizations: No    Attends Engineer, structural: Not on file    Marital Status: Divorced  Intimate Partner Violence: Not At Risk (03/20/2024)   Humiliation, Afraid, Rape, and Kick questionnaire    Fear of Current or Ex-Partner: No    Emotionally Abused: No    Physically Abused: No    Sexually Abused: No   Family Status  Relation Name Status   Mother  Alive   Father  Deceased at age 43   Son  Alive   Son  Alive   Mat Aunt  (Not Specified)   Youth worker  (Not Specified)   MGM  Deceased   MGF  Deceased   PGM  Deceased   PGF  Deceased  No partnership data on file   Family History  Problem Relation Age of Onset   Heart Problems Mother        uterine   Arthritis Mother    Breast cancer Mother 96   Cancer Father 69   Mental illness Father        bipolar   Heart Problems Father    Hypertension Son    Asthma Son    Cancer Son        melanoma   Breast cancer Maternal Aunt 70       55   Breast cancer Maternal Aunt    Polymyositis Maternal Grandmother    Lung cancer Maternal Grandfather    Heart disease Paternal Grandmother        MI   Brain cancer Paternal Grandfather    Allergies  Allergen Reactions   Mobic  [Meloxicam ] Anaphylaxis   Mobic  [Meloxicam ] Swelling    Patient Care Team: Romond Pipkins, Lauraine SAILOR, DO as PCP - General (Family Medicine) Daphane Rosella, NP (Inactive) (Nurse Practitioner)   Medications: Outpatient Medications Prior to Visit  Medication Sig   albuterol (PROVENTIL HFA;VENTOLIN HFA) 108 (90 BASE) MCG/ACT inhaler Inhale 2 puffs into the lungs every 6 (six) hours as needed for wheezing or shortness of breath.   amLODipine  (NORVASC ) 5 MG tablet Take 1 tablet by mouth once daily   azelastine  (OPTIVAR ) 0.05 % ophthalmic solution Place 1 drop into both eyes 2 (two) times daily as needed.   buPROPion (WELLBUTRIN XL) 300 MG 24 hr tablet Take 300 mg by mouth daily.   diclofenac  Sodium (VOLTAREN ) 1 % GEL Apply 2 g topically 4 (four) times daily.    QUEtiapine Fumarate 150 MG TABS Take 1 tablet by mouth at bedtime.   traZODone (  DESYREL) 50 MG tablet Take 50 mg by mouth at bedtime as needed.   valACYclovir  (VALTREX ) 500 MG tablet Take 1 tablet (500 mg total) by mouth daily.   [DISCONTINUED] cromolyn  (OPTICROM ) 4 % ophthalmic solution Place 1-2 drops into both eyes 4 (four) times daily.   [DISCONTINUED] lisinopril  (ZESTRIL ) 40 MG tablet Take 1 tablet (40 mg total) by mouth daily.   [DISCONTINUED] pantoprazole  (PROTONIX ) 40 MG tablet Take 1 tablet (40 mg total) by mouth daily before breakfast.   [DISCONTINUED] SYMBICORT  160-4.5 MCG/ACT inhaler INHALE 2 PUFFS TWICE DAILY   No facility-administered medications prior to visit.    Review of Systems  Constitutional:  Negative for chills, fatigue and fever.  HENT:  Negative for congestion, ear pain, rhinorrhea, sneezing and sore throat.   Eyes: Negative.  Negative for pain and redness.  Respiratory:  Negative for cough, shortness of breath and wheezing.   Cardiovascular:  Negative for chest pain and leg swelling.  Gastrointestinal:  Negative for abdominal pain, blood in stool, constipation, diarrhea and nausea.  Endocrine: Negative for polydipsia and polyphagia.  Genitourinary: Negative.  Negative for dysuria, flank pain, hematuria, pelvic pain, vaginal bleeding and vaginal discharge.  Musculoskeletal:  Negative for arthralgias, back pain, gait problem and joint swelling.  Skin:  Negative for rash.  Neurological: Negative.  Negative for dizziness, tremors, seizures, weakness, light-headedness, numbness and headaches.  Hematological:  Negative for adenopathy.  Psychiatric/Behavioral: Negative.  Negative for behavioral problems, confusion and dysphoric mood. The patient is not nervous/anxious and is not hyperactive.    {Insert previous labs (optional):23779} {See past labs  Heme  Chem  Endocrine  Serology  Results Review (optional):1}  Objective    BP (!) 158/96 (BP Location: Right Arm,  Patient Position: Sitting, Cuff Size: Normal)   Pulse 90   Ht 5' 3 (1.6 m)   Wt 165 lb (74.8 kg)   LMP  (LMP Unknown)   SpO2 97%   BMI 29.23 kg/m  {Insert last BP/Wt (optional):23777}{See vitals history (optional):1}  Physical Exam Vitals and nursing note reviewed.  Constitutional:      General: She is awake.     Appearance: Normal appearance.  HENT:     Head: Normocephalic and atraumatic.     Right Ear: Tympanic membrane, ear canal and external ear normal.     Left Ear: Tympanic membrane, ear canal and external ear normal.     Nose: Nose normal.     Mouth/Throat:     Mouth: Mucous membranes are moist.     Pharynx: Oropharynx is clear. No oropharyngeal exudate or posterior oropharyngeal erythema.  Eyes:     General: No scleral icterus.    Extraocular Movements: Extraocular movements intact.     Conjunctiva/sclera: Conjunctivae normal.     Pupils: Pupils are equal, round, and reactive to light.  Neck:     Thyroid : No thyromegaly or thyroid  tenderness.  Cardiovascular:     Rate and Rhythm: Normal rate and regular rhythm.     Pulses: Normal pulses.     Heart sounds: Normal heart sounds.  Pulmonary:     Effort: Pulmonary effort is normal. No tachypnea, bradypnea or respiratory distress.     Breath sounds: Normal breath sounds. No stridor. No wheezing, rhonchi or rales.  Abdominal:     General: Bowel sounds are normal. There is no distension.     Palpations: Abdomen is soft. There is no mass.     Tenderness: There is no abdominal tenderness. There is no guarding.  Hernia: No hernia is present.  Musculoskeletal:     Cervical back: Normal range of motion and neck supple.     Right lower leg: No edema.     Left lower leg: No edema.  Lymphadenopathy:     Cervical: No cervical adenopathy.  Skin:    General: Skin is warm and dry.  Neurological:     Mental Status: She is alert and oriented to person, place, and time. Mental status is at baseline.  Psychiatric:        Mood  and Affect: Mood normal.        Behavior: Behavior normal.      The 10-year ASCVD risk score (Arnett DK, et al., 2019) is: 17.8%   Values used to calculate the score:     Age: 1 years     Clincally relevant sex: Female     Is Non-Hispanic African American: No     Diabetic: No     Tobacco smoker: Yes     Systolic Blood Pressure: 158 mmHg     Is BP treated: Yes     HDL Cholesterol: 58 mg/dL     Total Cholesterol: 224 mg/dL   Last depression screening scores    03/20/2024    3:19 PM 03/20/2024    3:18 PM 11/14/2023    3:29 PM  PHQ 2/9 Scores  PHQ - 2 Score 4 4 1   PHQ- 9 Score 16 13 2    Last fall risk screening    08/15/2023    1:15 PM  Fall Risk   Falls in the past year? 0  Number falls in past yr: 0  Injury with Fall? 0  Risk for fall due to : No Fall Risks  Follow up Falls prevention discussed;Education provided;Falls evaluation completed   Last Audit-C alcohol use screening    03/19/2024    8:52 PM  Alcohol Use Disorder Test (AUDIT)  1. How often do you have a drink containing alcohol? 3  2. How many drinks containing alcohol do you have on a typical day when you are drinking? 0  3. How often do you have six or more drinks on one occasion? 0  AUDIT-C Score 3   4. How often during the last year have you found that you were not able to stop drinking once you had started? 0  5. How often during the last year have you failed to do what was normally expected from you because of drinking? 0  6. How often during the last year have you needed a first drink in the morning to get yourself going after a heavy drinking session? 0  7. How often during the last year have you had a feeling of guilt of remorse after drinking? 0  8. How often during the last year have you been unable to remember what happened the night before because you had been drinking? 0  9. Have you or someone else been injured as a result of your drinking? 0  10. Has a relative or friend or a doctor or another  health worker been concerned about your drinking or suggested you cut down? 0  Alcohol Use Disorder Identification Test Final Score (AUDIT) 3      Patient-reported   A score of 3 or more in women, and 4 or more in men indicates increased risk for alcohol abuse, EXCEPT if all of the points are from question 1   No results found for any visits on 03/20/24.  Assessment &  Plan    Routine Health Maintenance and Physical Exam  Exercise Activities and Dietary recommendations  Goals   None     Immunization History  Administered Date(s) Administered   Influenza, Seasonal, Injecte, Preservative Fre 04/08/2023   Influenza,inj,Quad PF,6+ Mos 10/18/2016, 05/20/2017   PNEUMOCOCCAL CONJUGATE-20 08/15/2023   Pneumococcal Polysaccharide-23 03/18/2005, 09/04/2019   Td 08/14/2007   Zoster Recombinant(Shingrix ) 08/15/2023, 11/14/2023    Health Maintenance  Topic Date Due   DTaP/Tdap/Td (2 - Tdap) 08/14/2024 (Originally 08/13/2017)   INFLUENZA VACCINE  10/23/2024 (Originally 02/24/2024)   Lung Cancer Screening  09/07/2024   MAMMOGRAM  03/22/2025   Colonoscopy  09/11/2025   Cervical Cancer Screening (HPV/Pap Cotest)  03/10/2028   Pneumococcal Vaccine: 50+ Years  Completed   Hepatitis C Screening  Completed   HIV Screening  Completed   Zoster Vaccines- Shingrix   Completed   Hepatitis B Vaccines 19-59 Average Risk  Aged Out   HPV VACCINES  Aged Out   Meningococcal B Vaccine  Aged Out   COVID-19 Vaccine  Discontinued    Discussed health benefits of physical activity, and encouraged her to engage in regular exercise appropriate for her age and condition.   Annual physical exam  Primary hypertension -     Comprehensive metabolic panel with GFR -     Lisinopril ; Take 1 tablet (40 mg total) by mouth daily.  Dispense: 90 tablet; Refill: 3  Osteoporosis, unspecified osteoporosis type, unspecified pathological fracture presence -     Ambulatory referral to Endocrinology  Vitamin D   deficiency -     VITAMIN D  25 Hydroxy (Vit-D Deficiency, Fractures)  Mixed hyperlipidemia -     Lipid panel -     Atorvastatin  Calcium ; Take 1 tablet (10 mg total) by mouth daily.  Dispense: 90 tablet; Refill: 3  Gastroesophageal reflux disease, unspecified whether esophagitis present -     Vitamin B12 -     Pantoprazole  Sodium; Take 1 tablet (40 mg total) by mouth daily before breakfast.  Dispense: 30 tablet; Refill: 3  High risk medication use -     Vitamin B12  Polycythemia -     CBC with Differential/Platelet  Elevated hemoglobin A1c -     Hemoglobin A1c  Decreased glomerular filtration rate (GFR) -     Microalbumin / creatinine urine ratio  Chronic obstructive pulmonary disease, unspecified COPD type (HCC) -     Symbicort ; Inhale 2 puffs into the lungs 2 (two) times daily.  Dispense: 11 g; Refill: 11  Overweight with body mass index (BMI) of 29 to 29.9 in adult     Obesity (29.0-29.9) associated with hypertension, mixed hyperlipidemia; weight loss counseling Send weogvy; if not covered, will try topiramate   Ref hematology if Hgb still elevated.  Caloric restriction 1500 and exercise 150 minutes per week.  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     I discussed the assessment and treatment plan with the patient  The patient was provided an opportunity to ask questions and all were answered. The patient agreed with the plan and demonstrated an understanding of the instructions.   The patient was advised to call back or seek an in-person evaluation if the symptoms worsen or if the condition fails to improve as anticipated.    LAURAINE LOISE BUOY, DO  [facility] [phone] (phone) [phone] (fax)  [facility]

## 2024-03-21 ENCOUNTER — Encounter: Payer: Self-pay | Admitting: Family Medicine

## 2024-03-21 ENCOUNTER — Other Ambulatory Visit: Payer: Self-pay | Admitting: Family Medicine

## 2024-03-21 ENCOUNTER — Ambulatory Visit: Payer: Self-pay | Admitting: Family Medicine

## 2024-03-21 DIAGNOSIS — D751 Secondary polycythemia: Secondary | ICD-10-CM

## 2024-03-21 DIAGNOSIS — E559 Vitamin D deficiency, unspecified: Secondary | ICD-10-CM

## 2024-03-21 DIAGNOSIS — I10 Essential (primary) hypertension: Secondary | ICD-10-CM

## 2024-03-21 LAB — CBC WITH DIFFERENTIAL/PLATELET
Basophils Absolute: 0.1 x10E3/uL (ref 0.0–0.2)
Basos: 1 %
EOS (ABSOLUTE): 0.4 x10E3/uL (ref 0.0–0.4)
Eos: 3 %
Hematocrit: 46.3 % (ref 34.0–46.6)
Hemoglobin: 15.6 g/dL (ref 11.1–15.9)
Immature Grans (Abs): 0.1 x10E3/uL (ref 0.0–0.1)
Immature Granulocytes: 1 %
Lymphocytes Absolute: 3.8 x10E3/uL — ABNORMAL HIGH (ref 0.7–3.1)
Lymphs: 29 %
MCH: 30.1 pg (ref 26.6–33.0)
MCHC: 33.7 g/dL (ref 31.5–35.7)
MCV: 89 fL (ref 79–97)
Monocytes Absolute: 1.2 x10E3/uL — ABNORMAL HIGH (ref 0.1–0.9)
Monocytes: 9 %
Neutrophils Absolute: 7.6 x10E3/uL — ABNORMAL HIGH (ref 1.4–7.0)
Neutrophils: 57 %
Platelets: 343 x10E3/uL (ref 150–450)
RBC: 5.19 x10E6/uL (ref 3.77–5.28)
RDW: 13 % (ref 11.7–15.4)
WBC: 13.2 x10E3/uL — ABNORMAL HIGH (ref 3.4–10.8)

## 2024-03-21 LAB — COMPREHENSIVE METABOLIC PANEL WITH GFR
ALT: 23 IU/L (ref 0–32)
AST: 17 IU/L (ref 0–40)
Albumin: 4.3 g/dL (ref 3.9–4.9)
Alkaline Phosphatase: 123 IU/L — ABNORMAL HIGH (ref 44–121)
BUN/Creatinine Ratio: 11 — ABNORMAL LOW (ref 12–28)
BUN: 10 mg/dL (ref 8–27)
Bilirubin Total: <0.2 mg/dL (ref 0.0–1.2)
CO2: 23 mmol/L (ref 20–29)
Calcium: 9.5 mg/dL (ref 8.7–10.3)
Chloride: 104 mmol/L (ref 96–106)
Creatinine, Ser: 0.93 mg/dL (ref 0.57–1.00)
Globulin, Total: 2.7 g/dL (ref 1.5–4.5)
Glucose: 92 mg/dL (ref 70–99)
Potassium: 4.5 mmol/L (ref 3.5–5.2)
Sodium: 141 mmol/L (ref 134–144)
Total Protein: 7 g/dL (ref 6.0–8.5)
eGFR: 69 mL/min/1.73 (ref >59)

## 2024-03-21 LAB — LIPID PANEL
Chol/HDL Ratio: 3.9 ratio (ref 0.0–4.4)
Cholesterol, Total: 208 mg/dL — ABNORMAL HIGH (ref 100–199)
HDL: 54 mg/dL (ref >39)
LDL Chol Calc (NIH): 116 mg/dL — ABNORMAL HIGH (ref 0–99)
Triglycerides: 220 mg/dL — ABNORMAL HIGH (ref 0–149)
VLDL Cholesterol Cal: 38 mg/dL (ref 5–40)

## 2024-03-21 LAB — MICROALBUMIN / CREATININE URINE RATIO
Creatinine, Urine: 110.6 mg/dL
Microalb/Creat Ratio: <3 mg/g{creat} (ref 0–29)
Microalbumin, Urine: <3 ug/mL

## 2024-03-21 LAB — VITAMIN B12: Vitamin B-12: 635 pg/mL (ref 232–1245)

## 2024-03-21 LAB — HEMOGLOBIN A1C
Est. average glucose Bld gHb Est-mCnc: 120 mg/dL
Hgb A1c MFr Bld: 5.8 % — ABNORMAL HIGH (ref 4.8–5.6)

## 2024-03-21 LAB — VITAMIN D 25 HYDROXY (VIT D DEFICIENCY, FRACTURES): Vit D, 25-Hydroxy: 37.8 ng/mL (ref 30.0–100.0)

## 2024-03-21 MED ORDER — VITAMIN D (ERGOCALCIFEROL) 1.25 MG (50000 UNIT) PO CAPS: 50000.0000 [IU] | ORAL_CAPSULE | ORAL | 1 refills | Status: AC

## 2024-03-22 ENCOUNTER — Other Ambulatory Visit (HOSPITAL_COMMUNITY): Payer: Self-pay

## 2024-03-22 ENCOUNTER — Telehealth: Payer: Self-pay

## 2024-03-22 DIAGNOSIS — F3132 Bipolar disorder, current episode depressed, moderate: Secondary | ICD-10-CM | POA: Diagnosis not present

## 2024-03-22 NOTE — Telephone Encounter (Signed)
 Pharmacy Patient Advocate Encounter   Received notification from Onbase that prior authorization for Wegovy  0.25MG /0.5ML auto-injectors  is required/requested.   Insurance verification completed.   The patient is insured through [facility] MEDICAID .   Per test claim: PA required; PA submitted to above mentioned insurance via Latent Key/confirmation #/EOC [id number]   Status is pending

## 2024-03-23 ENCOUNTER — Other Ambulatory Visit (HOSPITAL_COMMUNITY): Payer: Self-pay

## 2024-03-23 NOTE — Telephone Encounter (Signed)
 Pharmacy Patient Advocate Encounter  Received notification from [facility] MEDICAID that Prior Authorization for Wegovy  0.25MG /0.5ML auto-injectors has been APPROVED  to 2.24.26. Ran test claim, Copay is $4.00. This test claim was processed through [facility]- copay amounts may vary at other pharmacies due to pharmacy/plan contracts, or as the patient moves through the different stages of their insurance plan.   PA #/Case ID/Reference #: [id number] via LATENT

## 2024-03-27 ENCOUNTER — Other Ambulatory Visit (HOSPITAL_COMMUNITY): Payer: Self-pay

## 2024-03-29 DIAGNOSIS — F3132 Bipolar disorder, current episode depressed, moderate: Secondary | ICD-10-CM | POA: Diagnosis not present

## 2024-04-03 ENCOUNTER — Encounter: Payer: Self-pay | Admitting: Family Medicine

## 2024-04-03 DIAGNOSIS — Z1231 Encounter for screening mammogram for malignant neoplasm of breast: Secondary | ICD-10-CM

## 2024-04-06 DIAGNOSIS — Z419 Encounter for procedure for purposes other than remedying health state, unspecified: Secondary | ICD-10-CM | POA: Diagnosis not present

## 2024-04-11 ENCOUNTER — Encounter: Payer: Self-pay | Admitting: Oncology

## 2024-04-11 ENCOUNTER — Inpatient Hospital Stay: Attending: Oncology | Admitting: Oncology

## 2024-04-11 ENCOUNTER — Inpatient Hospital Stay

## 2024-04-11 VITALS — BP 150/88 | HR 100 | Temp 97.6°F | Resp 16 | Wt 163.0 lb

## 2024-04-11 DIAGNOSIS — F319 Bipolar disorder, unspecified: Secondary | ICD-10-CM | POA: Diagnosis not present

## 2024-04-11 DIAGNOSIS — Z8261 Family history of arthritis: Secondary | ICD-10-CM | POA: Diagnosis not present

## 2024-04-11 DIAGNOSIS — R232 Flushing: Secondary | ICD-10-CM | POA: Insufficient documentation

## 2024-04-11 DIAGNOSIS — Z8619 Personal history of other infectious and parasitic diseases: Secondary | ICD-10-CM | POA: Insufficient documentation

## 2024-04-11 DIAGNOSIS — Z604 Social exclusion and rejection: Secondary | ICD-10-CM | POA: Insufficient documentation

## 2024-04-11 DIAGNOSIS — J4489 Other specified chronic obstructive pulmonary disease: Secondary | ICD-10-CM | POA: Diagnosis not present

## 2024-04-11 DIAGNOSIS — Z803 Family history of malignant neoplasm of breast: Secondary | ICD-10-CM | POA: Insufficient documentation

## 2024-04-11 DIAGNOSIS — Z808 Family history of malignant neoplasm of other organs or systems: Secondary | ICD-10-CM | POA: Diagnosis not present

## 2024-04-11 DIAGNOSIS — Z888 Allergy status to other drugs, medicaments and biological substances status: Secondary | ICD-10-CM | POA: Insufficient documentation

## 2024-04-11 DIAGNOSIS — Z5986 Financial insecurity: Secondary | ICD-10-CM | POA: Insufficient documentation

## 2024-04-11 DIAGNOSIS — D72829 Elevated white blood cell count, unspecified: Secondary | ICD-10-CM

## 2024-04-11 DIAGNOSIS — Z825 Family history of asthma and other chronic lower respiratory diseases: Secondary | ICD-10-CM | POA: Insufficient documentation

## 2024-04-11 DIAGNOSIS — Z818 Family history of other mental and behavioral disorders: Secondary | ICD-10-CM | POA: Insufficient documentation

## 2024-04-11 DIAGNOSIS — Z801 Family history of malignant neoplasm of trachea, bronchus and lung: Secondary | ICD-10-CM | POA: Diagnosis not present

## 2024-04-11 DIAGNOSIS — Z79899 Other long term (current) drug therapy: Secondary | ICD-10-CM | POA: Insufficient documentation

## 2024-04-11 DIAGNOSIS — F419 Anxiety disorder, unspecified: Secondary | ICD-10-CM | POA: Diagnosis not present

## 2024-04-11 DIAGNOSIS — M1990 Unspecified osteoarthritis, unspecified site: Secondary | ICD-10-CM | POA: Insufficient documentation

## 2024-04-11 DIAGNOSIS — F17210 Nicotine dependence, cigarettes, uncomplicated: Secondary | ICD-10-CM | POA: Insufficient documentation

## 2024-04-11 DIAGNOSIS — I10 Essential (primary) hypertension: Secondary | ICD-10-CM | POA: Insufficient documentation

## 2024-04-11 DIAGNOSIS — Z8249 Family history of ischemic heart disease and other diseases of the circulatory system: Secondary | ICD-10-CM | POA: Insufficient documentation

## 2024-04-11 DIAGNOSIS — D751 Secondary polycythemia: Secondary | ICD-10-CM | POA: Insufficient documentation

## 2024-04-11 LAB — CBC WITH DIFFERENTIAL/PLATELET
Abs Immature Granulocytes: 0.13 K/uL — ABNORMAL HIGH (ref 0.00–0.07)
Basophils Absolute: 0.1 K/uL (ref 0.0–0.1)
Basophils Relative: 1 %
Eosinophils Absolute: 0.4 K/uL (ref 0.0–0.5)
Eosinophils Relative: 3 %
HCT: 46.1 % — ABNORMAL HIGH (ref 36.0–46.0)
Hemoglobin: 15.2 g/dL — ABNORMAL HIGH (ref 12.0–15.0)
Immature Granulocytes: 1 %
Lymphocytes Relative: 23 %
Lymphs Abs: 2.8 K/uL (ref 0.7–4.0)
MCH: 29.1 pg (ref 26.0–34.0)
MCHC: 33 g/dL (ref 30.0–36.0)
MCV: 88.1 fL (ref 80.0–100.0)
Monocytes Absolute: 1 K/uL (ref 0.1–1.0)
Monocytes Relative: 8 %
Neutro Abs: 8.1 K/uL — ABNORMAL HIGH (ref 1.7–7.7)
Neutrophils Relative %: 64 %
Platelets: 331 K/uL (ref 150–400)
RBC: 5.23 MIL/uL — ABNORMAL HIGH (ref 3.87–5.11)
RDW: 13.5 % (ref 11.5–15.5)
WBC: 12.5 K/uL — ABNORMAL HIGH (ref 4.0–10.5)
nRBC: 0 % (ref 0.0–0.2)

## 2024-04-11 LAB — RETIC PANEL
Immature Retic Fract: 8.1 % (ref 2.3–15.9)
RBC.: 5.19 MIL/uL — ABNORMAL HIGH (ref 3.87–5.11)
Retic Count, Absolute: 51.9 K/uL (ref 19.0–186.0)
Retic Ct Pct: 1 % (ref 0.4–3.1)
Reticulocyte Hemoglobin: 33.3 pg (ref >27.9)

## 2024-04-11 LAB — TECHNOLOGIST SMEAR REVIEW: Plt Morphology: NORMAL

## 2024-04-11 LAB — LACTATE DEHYDROGENASE: LDH: 130 U/L (ref 98–192)

## 2024-04-11 NOTE — Progress Notes (Signed)
 Hematology/Oncology Consult note [phone] [phone]        REFERRING PROVIDER: Donzella Lauraine SAILOR, DO   CHIEF COMPLAINTS/REASON FOR VISIT:  Evaluation of polycythemia   ASSESSMENT & PLAN:   Elevated WBC count Chronic leukocytosis, likely secondary to smoking. Check CBC, flow cytometry, LDH.  Erythrocytosis Chronic erythrocytosis, hemoglobin and hematocrit level has trended down since the decrease of smoking. I think this is most likely reactive due to smoking.  Today's hemoglobin showed a further decrease of hemoglobin to 15.2 with a hematocrit of 46. Patient is motivated in smoke cessation.  I will hold off additional workup at this point.   Orders Placed This Encounter  Procedures   CBC with Differential/Platelet    Standing Status:   Future    Number of Occurrences:   1    Expected Date:   04/11/2024    Expiration Date:   07/10/2024   Retic Panel    Standing Status:   Future    Number of Occurrences:   1    Expected Date:   04/11/2024    Expiration Date:   07/10/2024   Flow cytometry panel-leukemia/lymphoma work-up    Standing Status:   Future    Number of Occurrences:   1    Expected Date:   04/11/2024    Expiration Date:   07/10/2024   Lactate dehydrogenase    Standing Status:   Future    Number of Occurrences:   1    Expected Date:   04/11/2024    Expiration Date:   07/10/2024   Technologist smear review    Standing Status:   Future    Number of Occurrences:   1    Expected Date:   04/11/2024    Expiration Date:   07/10/2024    Clinical information::   leukocytosis    All questions were answered. The patient knows to call the clinic with any problems, questions or concerns.  Zelphia Cap, MD, PhD [facility] Hematology Oncology 04/11/2024   HISTORY OF PRESENTING ILLNESS:   Valerie Goodman is a  63 y.o.  female with PMH listed below was seen in consultation at the request of  Pardue, Sarah N, DO  for evaluation of  polycythemia   Discussed the use of AI scribe software for clinical note transcription with the patient, who gave verbal consent to proceed.   She has a history of erythrocytosis with red blood cell counts ranging from 15.9 to 17.5 between 2017 and 2022. Her most recent CBC showed improved hemoglobin of 15.6.  No recent blood work results available at current EMR.  In 2023 or 2024.  She has reduced her smoking from a pack and a half a day in 2022 to less than half a pack a day currently.  She also has chronic high white blood cell count and wonders if it might be related to her history of mononucleosis during her teenage years, with relapses into her early twenties. No excessive sweating at night, but she sometimes experiences hot flushes, which she attributes to her mother's preference for a warm house.  Her past medical history includes successfully treated hepatitis C around 2017-2018. Her HIV status was last checked in 2016, and she does not wish to have it rechecked as she has not engaged in any activities that would put her at risk.  She uses an inhaler, Symbicort , which may contain steroids. She drinks alcohol very seldom, averaging a couple of drinks a week, usually after a hard  night at work. Her weight has been stable, and she reports a good appetite.  MEDICAL HISTORY:  Past Medical History:  Diagnosis Date   Abnormal weight gain    Allergy    Anxiety    Arthritis    Asthma    Bipolar 1 disorder (HCC)    Cancer (HCC)    Cervical pain    Chronic urticaria    Complication of anesthesia    wakes up during procedures, hard to sedate   COPD (chronic obstructive pulmonary disease) (HCC)    Depression    Depression, major, single episode, moderate (HCC) 05/20/2017   HCV (hepatitis C virus)    Hypertension    Low back pain    Menopause    Mood disorder (HCC)    Neuromuscular disorder (HCC)    Osteoporosis    Stress fracture     SURGICAL HISTORY: Past Surgical History:   Procedure Laterality Date   COLONOSCOPY WITH PROPOFOL  N/A 09/12/2015   Procedure: COLONOSCOPY WITH PROPOFOL ;  Surgeon: Rogelia Copping, MD;  Location: [facility];  Service: Endoscopy;  Laterality: N/A;   TUBAL LIGATION     WISDOM TOOTH EXTRACTION      SOCIAL HISTORY: Social History   Socioeconomic History   Marital status: Single    Spouse name: Not on file   Number of children: Not on file   Years of education: Not on file   Highest education level: Some college, no degree  Occupational History   Not on file  Tobacco Use   Smoking status: Every Day    Current packs/day: 0.75    Average packs/day: 0.8 packs/day for 38.7 years (29.0 ttl pk-yrs)    Types: Cigarettes    Start date: 49   Smokeless tobacco: Never  Vaping Use   Vaping status: Never Used  Substance and Sexual Activity   Alcohol use: Yes    Comment: drinks 2 days a week typically; 2 shots liqour and 2 budlight 06/25/21   Drug use: Yes    Types: Marijuana   Sexual activity: Yes  Other Topics Concern   Not on file  Social History Narrative   ** Merged History Encounter **       Social Drivers of Health   Financial Resource Strain: Medium Risk (03/19/2024)   Overall Financial Resource Strain (CARDIA)    Difficulty of Paying Living Expenses: Somewhat hard  Food Insecurity: No Food Insecurity (04/11/2024)   Hunger Vital Sign    Worried About Running Out of Food in the Last Year: Never true    Ran Out of Food in the Last Year: Never true  Transportation Needs: No Transportation Needs (04/11/2024)   PRAPARE - Administrator, Civil Service (Medical): No    Lack of Transportation (Non-Medical): No  Physical Activity: Insufficiently Active (03/19/2024)   Exercise Vital Sign    Days of Exercise per Week: 3 days    Minutes of Exercise per Session: 20 min  Stress: Stress Concern Present (03/19/2024)   [employer] of Occupational Health - Occupational Stress Questionnaire    Feeling of Stress:  To some extent  Social Connections: Socially Isolated (03/19/2024)   Social Connection and Isolation Panel    Frequency of Communication with Friends and Family: Three times a week    Frequency of Social Gatherings with Friends and Family: Patient declined    Attends Religious Services: Never    Database administrator or Organizations: No    Attends Banker Meetings:  Not on file    Marital Status: Divorced  Intimate Partner Violence: Not At Risk (04/11/2024)   Humiliation, Afraid, Rape, and Kick questionnaire    Fear of Current or Ex-Partner: No    Emotionally Abused: No    Physically Abused: No    Sexually Abused: No    FAMILY HISTORY: Family History  Problem Relation Age of Onset   Heart Problems Mother        uterine   Arthritis Mother    Breast cancer Mother 21   Cancer Father 40   Mental illness Father        bipolar   Heart Problems Father    Hypertension Son    Asthma Son    Cancer Son        melanoma   Breast cancer Maternal Aunt 70       55   Breast cancer Maternal Aunt    Polymyositis Maternal Grandmother    Lung cancer Maternal Grandfather    Heart disease Paternal Grandmother        MI   Brain cancer Paternal Grandfather     ALLERGIES:  is allergic to mobic  [meloxicam ] and mobic  [meloxicam ].  MEDICATIONS:  Current Outpatient Medications  Medication Sig Dispense Refill   albuterol (PROVENTIL HFA;VENTOLIN HFA) 108 (90 BASE) MCG/ACT inhaler Inhale 2 puffs into the lungs every 6 (six) hours as needed for wheezing or shortness of breath.     amLODipine  (NORVASC ) 5 MG tablet Take 1 tablet by mouth once daily 90 tablet 2   atorvastatin  (LIPITOR) 10 MG tablet Take 1 tablet (10 mg total) by mouth daily. 90 tablet 3   azelastine  (OPTIVAR ) 0.05 % ophthalmic solution Place 1 drop into both eyes 2 (two) times daily as needed. 6 mL 3   buPROPion (WELLBUTRIN XL) 300 MG 24 hr tablet Take 300 mg by mouth daily.     diclofenac  Sodium (VOLTAREN ) 1 % GEL Apply 2  g topically 4 (four) times daily.     lisinopril  (ZESTRIL ) 40 MG tablet Take 1 tablet (40 mg total) by mouth daily. 90 tablet 3   pantoprazole  (PROTONIX ) 40 MG tablet Take 1 tablet (40 mg total) by mouth daily before breakfast. 30 tablet 3   QUEtiapine Fumarate 150 MG TABS Take 1 tablet by mouth at bedtime.     semaglutide -weight management (WEGOVY ) 0.25 MG/0.5ML SOAJ SQ injection Inject 0.25 mg into the skin once a week for 28 days. 2 mL 0   SYMBICORT  160-4.5 MCG/ACT inhaler Inhale 2 puffs into the lungs 2 (two) times daily. 11 g 11   traZODone (DESYREL) 50 MG tablet Take 50 mg by mouth at bedtime as needed.     valACYclovir  (VALTREX ) 500 MG tablet Take 1 tablet (500 mg total) by mouth daily. 90 tablet 3   Vitamin D , Ergocalciferol , (DRISDOL ) 1.25 MG (50000 UNIT) CAPS capsule Take 1 capsule (50,000 Units total) by mouth every 7 (seven) days. 12 capsule 1   No current facility-administered medications for this visit.    Review of Systems  Constitutional:  Negative for appetite change, chills, fatigue and fever.  HENT:   Negative for hearing loss and voice change.   Eyes:  Negative for eye problems.  Respiratory:  Negative for chest tightness and cough.   Cardiovascular:  Negative for chest pain.  Gastrointestinal:  Negative for abdominal distention, abdominal pain and blood in stool.  Endocrine: Negative for hot flashes.  Genitourinary:  Negative for difficulty urinating and frequency.   Musculoskeletal:  Negative  for arthralgias.  Skin:  Negative for itching and rash.  Neurological:  Negative for extremity weakness.  Hematological:  Negative for adenopathy.  Psychiatric/Behavioral:  Negative for confusion.    PHYSICAL EXAMINATION:  Vitals:   04/11/24 1445 04/11/24 1500  BP: (!) 176/107 (!) 150/88  Pulse: 100   Resp: 16   Temp: 97.6 F (36.4 C)   SpO2: 97%    Filed Weights   04/11/24 1445  Weight: 163 lb (73.9 kg)    Physical Exam Constitutional:      General: She is not  in acute distress. HENT:     Head: Normocephalic and atraumatic.  Eyes:     General: No scleral icterus. Cardiovascular:     Rate and Rhythm: Normal rate and regular rhythm.     Heart sounds: Normal heart sounds.  Pulmonary:     Effort: Pulmonary effort is normal. No respiratory distress.     Breath sounds: No wheezing.  Abdominal:     General: Bowel sounds are normal. There is no distension.     Palpations: Abdomen is soft.  Musculoskeletal:        General: No deformity. Normal range of motion.     Cervical back: Normal range of motion and neck supple.  Skin:    General: Skin is warm and dry.     Findings: No erythema or rash.  Neurological:     Mental Status: She is alert and oriented to person, place, and time. Mental status is at baseline.     Cranial Nerves: No cranial nerve deficit.     Coordination: Coordination normal.  Psychiatric:        Mood and Affect: Mood normal.     LABORATORY DATA:  I have reviewed the data as listed    Latest Ref Rng & Units 04/11/2024    3:39 PM 03/20/2024    4:20 PM 06/25/2021    8:53 PM  CBC  WBC 4.0 - 10.5 K/uL 12.5  13.2  11.8   Hemoglobin 12.0 - 15.0 g/dL 84.7  84.3  84.0   Hematocrit 36.0 - 46.0 % 46.1  46.3  49.1   Platelets 150 - 400 K/uL 331  343  326       Latest Ref Rng & Units 03/20/2024    4:20 PM 02/15/2023    2:56 PM 06/25/2021    8:53 PM  CMP  Glucose 70 - 99 mg/dL 92  90  865   BUN 8 - 27 mg/dL 10  8  10    Creatinine 0.57 - 1.00 mg/dL 9.06  9.11  9.38   Sodium 134 - 144 mmol/L 141  140  139   Potassium 3.5 - 5.2 mmol/L 4.5  4.5  4.1   Chloride 96 - 106 mmol/L 104  102  104   CO2 20 - 29 mmol/L 23  22  27    Calcium  8.7 - 10.3 mg/dL 9.5  9.9  9.4   Total Protein 6.0 - 8.5 g/dL 7.0  7.3  8.2   Total Bilirubin 0.0 - 1.2 mg/dL <9.7  0.2  0.5   Alkaline Phos 44 - 121 IU/L 123  144  91   AST 0 - 40 IU/L 17  19  18    ALT 0 - 32 IU/L 23  22  16        RADIOGRAPHIC STUDIES: I have personally reviewed the radiological  images as listed and agreed with the findings in the report. DG Bone Density Result Date: 02/29/2024 EXAM:  DUAL X-RAY ABSORPTIOMETRY (DXA) FOR BONE MINERAL DENSITY 02/28/2024 3:13 pm CLINICAL DATA:  63 year old Female Postmenopausal. TECHNIQUE: An axial (e.g., hips, spine) and/or appendicular (e.g., radius) exam was performed, as appropriate, using GE Secretary/administrator at [facility]. Images are obtained for bone mineral density measurement and are not obtained for diagnostic purposes. [id number] Exclusions: None. COMPARISON:  09/09/2015 FINDINGS: Scan quality: Good. LUMBAR SPINE (L1-L4): BMD (in g/cm2): 0.874 T-score: -2.6 Z-score: -1.1 Rate of change from previous exam: No significant rate of change from previous exam. LEFT FEMORAL NECK: BMD (in g/cm2): 0.714 T-score: -2.3 Z-score: -0.9 LEFT TOTAL HIP: BMD (in g/cm2): 0.798 T-score: -1.7 Z-score: -0.5 RIGHT FEMORAL NECK: BMD (in g/cm2): 0.720 T-score: -2.3 Z-score: -0.9 RIGHT TOTAL HIP: BMD (in g/cm2): 0.789 T-score: -1.7 Z-score: -0.6 DUAL-FEMUR TOTAL MEAN: Rate of change from previous exam: No significant rate of change from previous exam. LEFT FOREARM (RADIUS 33%): BMD (in g/cm2): 0.737 T-score: -1.6 Z-score: -0.3 FRAX 10-YEAR PROBABILITY OF FRACTURE: FRAX not reported as the lowest BMD is not in the osteopenia range. IMPRESSION: Osteoporosis based on BMD. Fracture risk is increased. RECOMMENDATIONS: 1. All patients should optimize calcium  and vitamin D  intake. 2. Consider FDA-approved medical therapies in postmenopausal women and men aged 57 years and older, based on the following: - A hip or vertebral (clinical or morphometric) fracture - T-score less than or equal to -2.5 and secondary causes have been excluded. - Low bone mass (T-score between -1.0 and -2.5) and a 10-year probability of a hip fracture greater than or equal to 3% or a 10-year probability of a major osteoporosis-related fracture greater than or equal to 20% based on  the [place] -adapted WHO algorithm. - Clinician judgment and/or patient preferences may indicate treatment for people with 10-year fracture probabilities above or below these levels 3. Patients with diagnosis of osteoporosis or at high risk for fracture should have regular bone mineral density tests. For patients eligible for Medicare, routine testing is allowed once every 2 years. The testing frequency can be increased to one year for patients who have rapidly progressing disease, those who are receiving or discontinuing medical therapy to restore bone mass, or have additional risk factors. Electronically Signed   By: Reyes Phi M.D.   On: 02/29/2024 16:39

## 2024-04-11 NOTE — Assessment & Plan Note (Signed)
 Chronic leukocytosis, likely secondary to smoking. Check CBC, flow cytometry, LDH.

## 2024-04-11 NOTE — Assessment & Plan Note (Signed)
 Chronic erythrocytosis, hemoglobin and hematocrit level has trended down since the decrease of smoking. I think this is most likely reactive due to smoking.  Today's hemoglobin showed a further decrease of hemoglobin to 15.2 with a hematocrit of 46. Patient is motivated in smoke cessation.  I will hold off additional workup at this point.

## 2024-04-16 ENCOUNTER — Ambulatory Visit
Admission: RE | Admit: 2024-04-16 | Discharge: 2024-04-16 | Disposition: A | Discharge status: Home / Self Care | Source: Ambulatory Visit | Attending: Acute Care | Admitting: Acute Care

## 2024-04-16 ENCOUNTER — Ambulatory Visit: Payer: Self-pay | Admitting: Oncology

## 2024-04-16 DIAGNOSIS — I700 Atherosclerosis of aorta: Secondary | ICD-10-CM | POA: Diagnosis not present

## 2024-04-16 DIAGNOSIS — Z87891 Personal history of nicotine dependence: Secondary | ICD-10-CM | POA: Diagnosis not present

## 2024-04-16 DIAGNOSIS — R911 Solitary pulmonary nodule: Secondary | ICD-10-CM | POA: Insufficient documentation

## 2024-04-16 DIAGNOSIS — J432 Centrilobular emphysema: Secondary | ICD-10-CM | POA: Diagnosis not present

## 2024-04-16 DIAGNOSIS — D72829 Elevated white blood cell count, unspecified: Secondary | ICD-10-CM

## 2024-04-16 LAB — COMP PANEL: LEUKEMIA/LYMPHOMA

## 2024-04-17 NOTE — Telephone Encounter (Signed)
-----   Message from Zelphia Cap sent at 04/16/2024 11:18 PM EDT ----- Please let patient know that her increased white count is likely secondary to smoking.  Workup results are good.  I recommend patient to follow-up in 1 year, lab MD CBC CMP.  Encourage smoking  cessation. ----- Message ----- From: Rebecka, Lab In [place] Sent: 04/11/2024   3:49 PM EDT To: Zelphia Cap, MD

## 2024-04-17 NOTE — Telephone Encounter (Signed)
 Please schedule patient for lab/MD (cbc,cmp) in 1 year. Please notify pt of appts.

## 2024-04-20 ENCOUNTER — Telehealth: Payer: Self-pay

## 2024-04-20 NOTE — Telephone Encounter (Signed)
 Copied from CRM [id number]. Topic: Clinical - Prescription Issue >> Apr 20, 2024  1:42 PM Mia F wrote: Reason for CRM: Pt insurance sent her a letter stating they need a pre apporval for the semaglutide -weight management (WEGOVY ) 0.25 MG/0.5ML SOAJ SQ injection. They need more information on what the benefits of the medication are besides weight loss. Please call [phone] if there are any questions

## 2024-04-25 ENCOUNTER — Other Ambulatory Visit: Payer: Self-pay

## 2024-04-25 DIAGNOSIS — Z122 Encounter for screening for malignant neoplasm of respiratory organs: Secondary | ICD-10-CM

## 2024-04-25 DIAGNOSIS — F17210 Nicotine dependence, cigarettes, uncomplicated: Secondary | ICD-10-CM

## 2024-04-25 DIAGNOSIS — Z87891 Personal history of nicotine dependence: Secondary | ICD-10-CM

## 2024-05-01 ENCOUNTER — Other Ambulatory Visit: Payer: Self-pay | Admitting: Family Medicine

## 2024-05-01 DIAGNOSIS — E782 Mixed hyperlipidemia: Secondary | ICD-10-CM

## 2024-05-01 DIAGNOSIS — Z6829 Body mass index (BMI) 29.0-29.9, adult: Secondary | ICD-10-CM

## 2024-05-01 DIAGNOSIS — I10 Essential (primary) hypertension: Secondary | ICD-10-CM

## 2024-05-16 DIAGNOSIS — R5383 Other fatigue: Secondary | ICD-10-CM | POA: Diagnosis not present

## 2024-05-16 DIAGNOSIS — M810 Age-related osteoporosis without current pathological fracture: Secondary | ICD-10-CM | POA: Diagnosis not present

## 2024-05-30 DIAGNOSIS — M810 Age-related osteoporosis without current pathological fracture: Secondary | ICD-10-CM | POA: Diagnosis not present

## 2024-06-06 DIAGNOSIS — Z419 Encounter for procedure for purposes other than remedying health state, unspecified: Secondary | ICD-10-CM | POA: Diagnosis not present

## 2024-06-22 ENCOUNTER — Other Ambulatory Visit: Payer: Self-pay | Admitting: Medical Genetics

## 2024-06-25 ENCOUNTER — Ambulatory Visit: Admitting: Family Medicine

## 2024-06-28 ENCOUNTER — Other Ambulatory Visit
Admission: RE | Admit: 2024-06-28 | Discharge: 2024-06-28 | Disposition: A | Payer: Self-pay | Source: Ambulatory Visit | Attending: Medical Genetics | Admitting: Medical Genetics

## 2024-06-29 ENCOUNTER — Encounter

## 2024-07-08 LAB — GENECONNECT MOLECULAR SCREEN: Genetic Analysis Overall Interpretation: NEGATIVE

## 2024-07-09 ENCOUNTER — Encounter: Payer: Self-pay | Admitting: Family Medicine

## 2024-07-09 ENCOUNTER — Ambulatory Visit: Admitting: Family Medicine

## 2024-07-09 VITALS — BP 136/77 | HR 101 | Temp 98.5°F | Ht 63.0 in | Wt 161.8 lb

## 2024-07-09 DIAGNOSIS — M7071 Other bursitis of hip, right hip: Secondary | ICD-10-CM

## 2024-07-09 DIAGNOSIS — Z716 Tobacco abuse counseling: Secondary | ICD-10-CM

## 2024-07-09 DIAGNOSIS — E782 Mixed hyperlipidemia: Secondary | ICD-10-CM

## 2024-07-09 DIAGNOSIS — M797 Fibromyalgia: Secondary | ICD-10-CM | POA: Diagnosis not present

## 2024-07-09 DIAGNOSIS — F17200 Nicotine dependence, unspecified, uncomplicated: Secondary | ICD-10-CM | POA: Diagnosis not present

## 2024-07-09 DIAGNOSIS — M810 Age-related osteoporosis without current pathological fracture: Secondary | ICD-10-CM

## 2024-07-09 NOTE — Assessment & Plan Note (Addendum)
 Stopped atorvastatin  d/t achiness and fatigue with daytime dosing. Restart every other day at night

## 2024-07-09 NOTE — Progress Notes (Unsigned)
 Established patient visit   Patient: Valerie Goodman   DOB: 1960/11/08   63 y.o. Female  MRN: [id number] Visit Date: 07/09/2024  Today's healthcare provider: LAURAINE LOISE BUOY, DO   Chief Complaint  Patient presents with   Medical Management of Chronic Issues    Patient is here today for a 3 month follow up.  Only concerns that she has is that she has bursitis in her right hip and it has been bothering her really bad.   Subjective    HPI      ***  {History (Optional):23778}  Medications: Show/hide medication list[1]   {Insert previous labs (optional):23779} {See past labs  Heme  Chem  Endocrine  Serology  Results Review (optional):1}   Objective    BP 136/77 (BP Location: Right Arm, Patient Position: Sitting, Cuff Size: Normal)   Pulse (!) 101   Temp 98.5 F (36.9 C) (Oral)   Ht 5' 3 (1.6 m)   Wt 161 lb 12.8 oz (73.4 kg)   LMP  (LMP Unknown)   SpO2 98%   BMI 28.66 kg/m  {Insert last BP/Wt (optional):23777}{See vitals history (optional):1}   Physical Exam Constitutional:      Appearance: Normal appearance.  HENT:     Head: Normocephalic and atraumatic.  Eyes:     General: No scleral icterus.    Extraocular Movements: Extraocular movements intact.     Conjunctiva/sclera: Conjunctivae normal.  Cardiovascular:     Rate and Rhythm: Normal rate and regular rhythm.     Pulses: Normal pulses.     Heart sounds: Normal heart sounds.  Pulmonary:     Effort: Pulmonary effort is normal. No respiratory distress.     Breath sounds: Normal breath sounds.  Musculoskeletal:     Right lower leg: No edema.     Left lower leg: No edema.  Skin:    General: Skin is warm and dry.  Neurological:     Mental Status: She is alert and oriented to person, place, and time. Mental status is at baseline.  Psychiatric:        Mood and Affect: Mood normal.        Behavior: Behavior normal.      No results found for any visits on 07/09/24.  Assessment & Plan     Bursitis of right hip, unspecified bursa -     Ambulatory referral to Physical Therapy  Mixed hyperlipidemia Assessment & Plan: Stopped atorvastatin  d/t achiness and fatigue with daytime dosing. Restart every other day at night     ***  Return in about 3 months (around 10/07/2024) for DM, HTN, Chronic f/u, and in 6 months for .      I discussed the assessment and treatment plan with the patient  The patient was provided an opportunity to ask questions and all were answered. The patient agreed with the plan and demonstrated an understanding of the instructions.   The patient was advised to call back or seek an in-person evaluation if the symptoms worsen or if the condition fails to improve as anticipated.    LAURAINE LOISE BUOY, DO  [facility] [phone] (phone) [phone] (fax)  [facility]    [1]  Outpatient Medications Prior to Visit  Medication Sig   albuterol (PROVENTIL HFA;VENTOLIN HFA) 108 (90 BASE) MCG/ACT inhaler Inhale 2 puffs into the lungs every 6 (six) hours as needed for wheezing or shortness of breath.   amLODipine  (NORVASC ) 5 MG tablet Take  1 tablet by mouth once daily   atorvastatin  (LIPITOR) 10 MG tablet Take 1 tablet (10 mg total) by mouth daily.   azelastine  (OPTIVAR ) 0.05 % ophthalmic solution Place 1 drop into both eyes 2 (two) times daily as needed.   buPROPion (WELLBUTRIN XL) 300 MG 24 hr tablet Take 300 mg by mouth daily.   diclofenac  Sodium (VOLTAREN ) 1 % GEL Apply 2 g topically 4 (four) times daily.   lisinopril  (ZESTRIL ) 40 MG tablet Take 1 tablet (40 mg total) by mouth daily.   pantoprazole  (PROTONIX ) 40 MG tablet Take 1 tablet (40 mg total) by mouth daily before breakfast.   QUEtiapine Fumarate 150 MG TABS Take 1 tablet by mouth at bedtime.   SYMBICORT  160-4.5 MCG/ACT inhaler Inhale 2 puffs into the lungs 2 (two) times daily.   valACYclovir  (VALTREX ) 500 MG tablet Take 1 tablet (500 mg total) by mouth  daily.   Vitamin D , Ergocalciferol , (DRISDOL ) 1.25 MG (50000 UNIT) CAPS capsule Take 1 capsule (50,000 Units total) by mouth every 7 (seven) days.   [DISCONTINUED] traZODone (DESYREL) 50 MG tablet Take 50 mg by mouth at bedtime as needed.   No facility-administered medications prior to visit.

## 2024-07-10 DIAGNOSIS — F3132 Bipolar disorder, current episode depressed, moderate: Secondary | ICD-10-CM | POA: Diagnosis not present

## 2024-07-12 ENCOUNTER — Encounter

## 2024-10-09 ENCOUNTER — Ambulatory Visit: Admitting: Family Medicine

## 2025-04-17 ENCOUNTER — Other Ambulatory Visit

## 2025-04-17 ENCOUNTER — Ambulatory Visit: Admitting: Oncology
